# Patient Record
Sex: Male | Born: 1990 | Race: Black or African American | Hispanic: No | Marital: Single
Health system: Midwestern US, Community
[De-identification: ages and names within clinical notes are randomized; demographics above are authoritative.]

## PROBLEM LIST (undated history)

## (undated) HISTORY — PX: WISDOM TOOTH EXTRACTION: SHX21

---
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
## 2010-06-21 ENCOUNTER — Emergency Department (HOSPITAL_COMMUNITY): Admission: EM | Admit: 2010-06-21 | Discharge: 2010-06-21 | Payer: Self-pay | Admitting: Family Medicine

## 2010-10-24 ENCOUNTER — Emergency Department (HOSPITAL_COMMUNITY)
Admission: EM | Admit: 2010-10-24 | Discharge: 2010-10-24 | Payer: Self-pay | Source: Home / Self Care | Admitting: Family Medicine

## 2010-11-04 ENCOUNTER — Emergency Department (HOSPITAL_COMMUNITY)
Admission: EM | Admit: 2010-11-04 | Discharge: 2010-11-04 | Payer: Self-pay | Source: Home / Self Care | Admitting: Family Medicine

## 2011-01-25 LAB — GC/CHLAMYDIA PROBE AMP, GENITAL
Chlamydia, DNA Probe: POSITIVE — AB
GC Probe Amp, Genital: NEGATIVE

## 2011-01-29 LAB — GC/CHLAMYDIA PROBE AMP, GENITAL: GC Probe Amp, Genital: POSITIVE — AB

## 2011-10-14 ENCOUNTER — Emergency Department (HOSPITAL_COMMUNITY)
Admission: EM | Admit: 2011-10-14 | Discharge: 2011-10-14 | Disposition: A | Discharge status: Home / Self Care | Payer: 59 | Source: Home / Self Care

## 2011-10-14 ENCOUNTER — Encounter: Payer: Self-pay | Admitting: Emergency Medicine

## 2011-10-14 DIAGNOSIS — Z202 Contact with and (suspected) exposure to infections with a predominantly sexual mode of transmission: Secondary | ICD-10-CM

## 2011-10-14 LAB — RPR: RPR Ser Ql: NONREACTIVE

## 2011-10-14 NOTE — ED Notes (Signed)
Patient reports partner may have genital herpes.  Patient wants to be checked.  Denies rashes, denies pain, denies blisters, denies discharge

## 2011-10-17 ENCOUNTER — Encounter (HOSPITAL_COMMUNITY): Payer: Self-pay | Admitting: Physician Assistant

## 2011-10-17 NOTE — ED Provider Notes (Signed)
Medical screening examination/treatment/procedure(s) were performed by non-physician practitioner and as supervising physician I was immediately available for consultation/collaboration.  Corrie Mckusick, MD 10/17/11 2033

## 2011-10-17 NOTE — ED Provider Notes (Signed)
History     CSN: [id number] Arrival date & time: 10/14/2011 11:57 AM   None     Chief Complaint  Patient presents with  . Exposure to STD    (Consider location/radiation/quality/duration/timing/severity/associated sxs/prior treatment) HPI Comments: Pt states his girlfriend recently diagnosed with genital herpes - has an outbreak. He denies hx of genital lesions or ulcers. Requests testing for herpes. No dysuria or penile discharge. Has questions regarding herpes, transmission, etc.   Patient is a 20 y.o. male presenting with STD exposure. The history is provided by the patient.  Exposure to STD This is a new problem. Episode onset: unknown. Pertinent negatives include no chest pain, no abdominal pain, no headaches and no shortness of breath. The symptoms are aggravated by nothing. The symptoms are relieved by nothing. He has tried nothing for the symptoms.    History reviewed. No pertinent past medical history.  History reviewed. No pertinent past surgical history.  History reviewed. No pertinent family history.  History  Substance Use Topics  . Smoking status: Never Smoker   . Smokeless tobacco: Not on file  . Alcohol Use: Yes      Review of Systems  Constitutional: Negative for fever and chills.  Respiratory: Negative for shortness of breath.   Cardiovascular: Negative for chest pain.  Gastrointestinal: Negative for abdominal pain.  Genitourinary: Negative for dysuria, discharge, genital sores and penile pain.  Neurological: Negative for headaches.    Allergies  Review of patient's allergies indicates no known allergies.  Home Medications  No current outpatient prescriptions on file.  BP 121/66  Pulse 80  Temp(Src) 98.4 F (36.9 C) (Oral)  Resp 18  SpO2 99%  Physical Exam  Nursing note and vitals reviewed. Constitutional: He appears well-developed and well-nourished. No distress.  Cardiovascular: Normal rate, regular rhythm and normal heart sounds.     Pulmonary/Chest: Effort normal and breath sounds normal. No respiratory distress.  Genitourinary: Testes normal and penis normal. Circumcised.  Lymphadenopathy:       Right: No inguinal adenopathy present.       Left: No inguinal adenopathy present.  Skin: Skin is warm and dry. No rash noted.  Psychiatric: He has a normal mood and affect.    ED Course  Procedures (including critical care time)   Labs Reviewed  GC/CHLAMYDIA PROBE AMP, GENITAL  HIV ANTIBODY (ROUTINE TESTING)  HSV 2 ANTIBODY, IGG  RPR  LAB REPORT - SCANNED   No results found.   1. Exposure to STD       MDM          Melody Comas, PA 10/17/11 [phone]

## 2011-10-18 ENCOUNTER — Telehealth (HOSPITAL_COMMUNITY): Payer: Self-pay | Admitting: *Deleted

## 2012-05-12 ENCOUNTER — Emergency Department (HOSPITAL_COMMUNITY)
Admission: EM | Admit: 2012-05-12 | Discharge: 2012-05-12 | Disposition: A | Discharge status: Home / Self Care | Payer: 59 | Source: Home / Self Care | Attending: Family Medicine | Admitting: Family Medicine

## 2012-05-12 ENCOUNTER — Encounter (HOSPITAL_COMMUNITY): Payer: Self-pay | Admitting: *Deleted

## 2012-05-12 DIAGNOSIS — Z202 Contact with and (suspected) exposure to infections with a predominantly sexual mode of transmission: Secondary | ICD-10-CM

## 2012-05-12 MED ORDER — AZITHROMYCIN 250 MG PO TABS
ORAL_TABLET | ORAL | Status: AC
  Filled 2012-05-12: qty 4

## 2012-05-12 MED ORDER — CEFTRIAXONE SODIUM 250 MG IJ SOLR
INTRAMUSCULAR | Status: AC
  Filled 2012-05-12: qty 250

## 2012-05-12 MED ORDER — AZITHROMYCIN 250 MG PO TABS: 1000.0000 mg | ORAL_TABLET | Freq: Once | ORAL | Status: DC

## 2012-05-12 MED ORDER — CEFTRIAXONE SODIUM 1 G IJ SOLR: 250.0000 mg | Freq: Once | INTRAMUSCULAR | Status: DC

## 2012-05-12 NOTE — Discharge Instructions (Signed)
We will call with test results and treat as indicated °

## 2012-05-12 NOTE — ED Notes (Signed)
Pt reports   He  May  Have  Been exposed  To  An  STD  About 1  Week  Ago     He  denys  Any  Symptoms         He  Also  Reports   Chronic  Back pains      denys  Any  Recent injury

## 2012-05-12 NOTE — ED Provider Notes (Signed)
History     CSN: [id number]  Arrival date & time 05/12/12  1416   First MD Initiated Contact with Patient 05/12/12 1417      Chief Complaint  Patient presents with  . Exposure to STD    (Consider location/radiation/quality/duration/timing/severity/associated sxs/prior treatment) Patient is a 21 y.o. male presenting with STD exposure. The history is provided by the patient.  Exposure to STD This is a new problem. The current episode started more than 1 week ago. The problem has not changed since onset.Associated symptoms comments: Pt denies any sx.Marland Kitchen    History reviewed. No pertinent past medical history.  History reviewed. No pertinent past surgical history.  No family history on file.  History  Substance Use Topics  . Smoking status: Never Smoker   . Smokeless tobacco: Not on file  . Alcohol Use: Yes      Review of Systems  Constitutional: Negative.   Gastrointestinal: Negative.   Genitourinary: Negative.   Skin: Negative for rash.    Allergies  Review of patient's allergies indicates no known allergies.  Home Medications  No current outpatient prescriptions on file.  There were no vitals taken for this visit.  Physical Exam  Nursing note and vitals reviewed. Constitutional: He is oriented to person, place, and time. He appears well-developed and well-nourished.  Abdominal: Soft. Bowel sounds are normal. There is no tenderness. Hernia confirmed negative in the right inguinal area and confirmed negative in the left inguinal area.  Genitourinary: Testes normal and penis normal. Circumcised. No penile erythema or penile tenderness. No discharge found.  Lymphadenopathy:       Right: No inguinal adenopathy present.       Left: No inguinal adenopathy present.  Neurological: He is alert and oriented to person, place, and time.  Skin: Skin is warm and dry.    ED Course  Procedures (including critical care time)   Labs Reviewed  GC/CHLAMYDIA PROBE AMP,  GENITAL   No results found.   1. Exposure to STD       MDM          Linna Hoff, MD 05/12/12 1520

## 2012-05-15 ENCOUNTER — Telehealth (HOSPITAL_COMMUNITY): Payer: Self-pay | Admitting: *Deleted

## 2012-05-15 LAB — GC/CHLAMYDIA PROBE AMP, GENITAL
Chlamydia, DNA Probe: POSITIVE — AB
GC Probe Amp, Genital: NEGATIVE

## 2012-05-15 NOTE — ED Notes (Signed)
GC neg., Chlamydia pos.  Pt. adequately treated with Zithromax and Rocephin.  I called pt. Pt. verified x 2 and given results.  The patient was told he was adequately treated.  Pt. instructed to notify his partner, no sex for 1 week and to practice safe sex. Pt. told he can get HIV testing at the [facility]. STD clinic.  [facility] form completed and faxed to the [facility]. [name] 05/15/2012

## 2012-06-27 ENCOUNTER — Other Ambulatory Visit: Payer: Self-pay | Admitting: Ophthalmology

## 2012-06-27 DIAGNOSIS — H5509 Other forms of nystagmus: Secondary | ICD-10-CM

## 2012-06-27 DIAGNOSIS — H538 Other visual disturbances: Secondary | ICD-10-CM

## 2012-06-28 ENCOUNTER — Ambulatory Visit
Admission: RE | Admit: 2012-06-28 | Discharge: 2012-06-28 | Disposition: A | Discharge status: Home / Self Care | Payer: 59 | Source: Ambulatory Visit | Attending: Ophthalmology | Admitting: Ophthalmology

## 2012-06-28 DIAGNOSIS — H538 Other visual disturbances: Secondary | ICD-10-CM

## 2012-06-28 DIAGNOSIS — H5509 Other forms of nystagmus: Secondary | ICD-10-CM

## 2012-06-28 MED ORDER — GADOBENATE DIMEGLUMINE 529 MG/ML IV SOLN
20.0000 mL | Freq: Once | INTRAVENOUS | Status: AC | PRN
  Administered 2012-06-28: 20 mL via INTRAVENOUS

## 2012-11-15 ENCOUNTER — Encounter (HOSPITAL_COMMUNITY): Payer: Self-pay | Admitting: Emergency Medicine

## 2012-11-15 ENCOUNTER — Other Ambulatory Visit (HOSPITAL_COMMUNITY)
Admission: RE | Admit: 2012-11-15 | Discharge: 2012-11-15 | Disposition: A | Discharge status: Home / Self Care | Payer: 59 | Source: Ambulatory Visit | Attending: Emergency Medicine | Admitting: Emergency Medicine

## 2012-11-15 ENCOUNTER — Emergency Department (HOSPITAL_COMMUNITY)
Admission: EM | Admit: 2012-11-15 | Discharge: 2012-11-15 | Disposition: A | Discharge status: Home / Self Care | Payer: 59 | Source: Home / Self Care | Attending: Emergency Medicine | Admitting: Emergency Medicine

## 2012-11-15 DIAGNOSIS — Z113 Encounter for screening for infections with a predominantly sexual mode of transmission: Secondary | ICD-10-CM | POA: Insufficient documentation

## 2012-11-15 DIAGNOSIS — Z202 Contact with and (suspected) exposure to infections with a predominantly sexual mode of transmission: Secondary | ICD-10-CM

## 2012-11-15 MED ORDER — AZITHROMYCIN 250 MG PO TABS
1000.0000 mg | ORAL_TABLET | Freq: Once | ORAL | Status: AC
  Administered 2012-11-15: 1000 mg via ORAL

## 2012-11-15 MED ORDER — AZITHROMYCIN 250 MG PO TABS
ORAL_TABLET | ORAL | Status: AC
  Filled 2012-11-15: qty 4

## 2012-11-15 NOTE — ED Notes (Signed)
Denies any sxs.  Reports exposure to STD.

## 2012-11-15 NOTE — ED Provider Notes (Signed)
Chief Complaint  Patient presents with  . Exposure to STD    History of Present Illness:   The patient is a 22 year old male who was informed by his partner yesterday that she tested positive for Chlamydia. They last sexual contact was about a week ago. He has had Chlamydia in the past. He denies any symptoms such as discharge, dysuria, or penile pain. He's had no lesions on his penis including ulcerations, sores, blisters, bumps, or rash. No inguinal lymphadenopathy. He denies any systemic symptoms such as fever, chills, headache, stiff neck, skin rash, or joint pain.  Review of Systems:  Other than noted above, the patient denies any of the following symptoms: Systemic:  No fevers chills, aches, weight loss, arthralgias, myalgias, or adenopathy. GI:  No abdominal pain, nausea or vomiting. GU:  No dysuria, penile pain, discharge, itching, dysuria, genital lesions, testicular pain or swelling. Skin:  No rash or itching.  PMFSH:  Past medical history, family history, social history, meds, and allergies were reviewed.  Physical Exam:   Vital signs:  BP 151/69  Pulse 81  Temp 98.6 F (37 C) (Oral)  Resp 18  SpO2 100% Gen:  Alert, oriented, in no distress. Abdomen:  Soft and flat, non-distended, and non-tender.  No organomegaly or mass. Genital:  Exam is completely normal. There was no urethral discharge, no lesions on the penis, normal testes, no hernia, no inguinal lymphadenopathy Skin:  Warm and dry.  No rash.   Other Labs Obtained at [facility]:  DNA probes for gonorrhea, chlamydia, trichomonas were obtained as well as serologies for HIV and syphilis.  Results are pending at this time and we will call about any positive results.  Medications given in [facility]:  He was given azithromycin 1000 mg by mouth and tolerated this well without any immediate side effects.  Assessment:  The encounter diagnosis was Exposure to STD.  Plan:   1.  The following meds were prescribed:   New  Prescriptions   No medications on file   2.  The patient was instructed in symptomatic care and handouts were given. 3.  The patient was told to return if becoming worse in any way, if no better in 3 or 4 days, and given some red flag symptoms that would indicate earlier return. 4.  The patient was instructed to inform all sexual contacts, avoid intercourse completely for 2 weeks and then only with a condom.  The patient was told that we would call about all abnormal lab results, and that we would need to report certain kinds of infection to the health department.    Reuben Likes, MD 11/15/12 [phone]

## 2012-11-16 LAB — HIV ANTIBODY (ROUTINE TESTING W REFLEX): HIV: NONREACTIVE

## 2012-11-16 LAB — RPR: RPR Ser Ql: NONREACTIVE

## 2013-11-29 ENCOUNTER — Encounter (HOSPITAL_COMMUNITY): Payer: Self-pay | Admitting: Emergency Medicine

## 2013-11-29 ENCOUNTER — Emergency Department (HOSPITAL_COMMUNITY)
Admission: EM | Admit: 2013-11-29 | Discharge: 2013-11-29 | Disposition: A | Discharge status: Home / Self Care | Payer: 59 | Source: Home / Self Care

## 2013-11-29 ENCOUNTER — Emergency Department (INDEPENDENT_AMBULATORY_CARE_PROVIDER_SITE_OTHER): Payer: 59

## 2013-11-29 DIAGNOSIS — Y9367 Activity, basketball: Secondary | ICD-10-CM

## 2013-11-29 DIAGNOSIS — S93409A Sprain of unspecified ligament of unspecified ankle, initial encounter: Secondary | ICD-10-CM

## 2013-11-29 DIAGNOSIS — S93402A Sprain of unspecified ligament of left ankle, initial encounter: Secondary | ICD-10-CM

## 2013-11-29 MED ORDER — SODIUM CHLORIDE 0.9 % IV BOLUS (SEPSIS)
1000.0000 mL | Freq: Once | INTRAVENOUS | Status: DC
Start: 2013-11-29 — End: 2013-11-29

## 2013-11-29 NOTE — ED Provider Notes (Signed)
CSN: [id number]     Arrival date & time 11/29/13  0820 History   First MD Initiated Contact with Patient 11/29/13 [phone]     Chief Complaint  Patient presents with  . Ankle Pain   (Consider location/radiation/quality/duration/timing/severity/associated sxs/prior Treatment) HPI Comments: 23 year old male was playing basketball yesterdayinjured his left ankle describing an inversion type injury. Is complaining of pain to the medial and lateral aspect of the malleolus. This occurred yesterday p.m. Is painful to bear weight. There is no deformity but there is swelling about the ankle.  Patient is a 23 y.o. male presenting with ankle pain.  Ankle Pain   History reviewed. No pertinent past medical history. History reviewed. No pertinent past surgical history. History reviewed. No pertinent family history. History  Substance Use Topics  . Smoking status: Current Every Day Smoker  . Smokeless tobacco: Not on file  . Alcohol Use: No    Review of Systems  Constitutional: Negative.   Respiratory: Negative.   Gastrointestinal: Negative.   Genitourinary: Negative.   Musculoskeletal: Positive for joint swelling.       As per HPI  Skin: Negative.   Neurological: Negative for dizziness, weakness, numbness and headaches.    Allergies  Review of patient's allergies indicates no known allergies.  Home Medications  No current outpatient prescriptions on file. BP 137/76  Pulse 80  Temp(Src) 97.8 F (36.6 C) (Oral)  Resp 16  SpO2 100% Physical Exam  Nursing note and vitals reviewed. Constitutional: He is oriented to person, place, and time. He appears well-developed and well-nourished.  HENT:  Head: Normocephalic and atraumatic.  Eyes: EOM are normal. Left eye exhibits no discharge.  Neck: Normal range of motion. Neck supple.  Pulmonary/Chest: Effort normal. No respiratory distress.  Musculoskeletal:  Tenderness to the soft tissues surrounding the left ankle. Mild swelling in the same  area. Full range of motion. Dorsiflexion and plantarflexion intact. No metatarsal tenderness or deformity. Distal neurovascular motor sensory is intact. Pedal pulses 2+.  Neurological: He is alert and oriented to person, place, and time. No cranial nerve deficit.  Skin: Skin is warm and dry.  Psychiatric: He has a normal mood and affect.    ED Course  Procedures (including critical care time) Labs Review Labs Reviewed - No data to display Imaging Review Dg Ankle Complete Left  11/29/2013   CLINICAL DATA:  Left ankle pain after injury.  EXAM: LEFT ANKLE COMPLETE - 3+ VIEW  COMPARISON:  None.  FINDINGS: There is no evidence of fracture, dislocation, or joint effusion. There is no evidence of arthropathy or other focal bone abnormality. Soft tissues are unremarkable.  IMPRESSION: Normal left ankle.   Electronically Signed   By: Roque LiasJames  Green M.D.   On: 11/29/2013 09:18      MDM   1. Sprain of ankle, left    RICE ASO splint Crutches After 3-4 d gradually start wt bearing as tolerated.    Hayden Rasmussenavid Yakub Lodes, NP 11/29/13 [phone]

## 2013-11-29 NOTE — Discharge Instructions (Signed)
Acute Ankle Sprain  with Phase I Rehab  An acute ankle sprain is a partial or complete tear in one or more of the ligaments of the ankle due to traumatic injury. The severity of the injury depends on both the the number of ligaments sprained and the grade of sprain. There are 3 grades of sprains.   · A grade 1 sprain is a mild sprain. There is a slight pull without obvious tearing. There is no loss of strength, and the muscle and ligament are the correct length.  · A grade 2 sprain is a moderate sprain. There is tearing of fibers within the substance of the ligament where it connects two bones or two cartilages. The length of the ligament is increased, and there is usually decreased strength.  · A grade 3 sprain is a complete rupture of the ligament and is uncommon.  In addition to the grade of sprain, there are three types of ankle sprains.   Lateral ankle sprains: This is a sprain of one or more of the three ligaments on the outer side (lateral) of the ankle. These are the most common sprains.  Medial ankle sprains: There is one large triangular ligament of the inner side (medial) of the ankle that is susceptible to injury. Medial ankle sprains are less common.  Syndesmosis, "high ankle," sprains: The syndesmosis is the ligament that connects the two bones of the lower leg. Syndesmosis sprains usually only occur with very severe ankle sprains.  SYMPTOMS  · Pain, tenderness, and swelling in the ankle, starting at the side of injury that may progress to the whole ankle and foot with time.  · "Pop" or tearing sensation at the time of injury.  · Bruising that may spread to the heel.  · Impaired ability to walk soon after injury.  CAUSES   · Acute ankle sprains are caused by trauma placed on the ankle that temporarily forces or pries the anklebone (talus) out of its normal socket.  · Stretching or tearing of the ligaments that normally hold the joint in place (usually due to a twisting injury).  RISK INCREASES  WITH:  · Previous ankle sprain.  · Sports in which the foot may land awkwardly (ie. basketball, volleyball, or soccer) or walking or running on uneven or rough surfaces.  · Shoes with inadequate support to prevent sideways motion when stress occurs.  · Poor strength and flexibility.  · Poor balance skills.  · Contact sports.  PREVENTION   · Warm up and stretch properly before activity.  · Maintain physical fitness:  · Ankle and leg flexibility, muscle strength, and endurance.  · Cardiovascular fitness.  · Balance training activities.  · Use proper technique and have a coach correct improper technique.  · Taping, protective strapping, bracing, or high-top tennis shoes may help prevent injury. Initially, tape is best; however, it loses most of its support function within 10 to 15 minutes.  · Wear proper fitted protective shoes (High-top shoes with taping or bracing is more effective than either alone).  · Provide the ankle with support during sports and practice activities for 12 months following injury.  PROGNOSIS   · If treated properly, ankle sprains can be expected to recover completely; however, the length of recovery depends on the degree of injury.  · A grade 1 sprain usually heals enough in 5 to 7 days to allow modified activity and requires an average of 6 weeks to heal completely.  · A grade 2 sprain requires   6 to 10 weeks to heal completely.  · A grade 3 sprain requires 12 to 16 weeks to heal.  · A syndesmosis sprain often takes more than 3 months to heal.  RELATED COMPLICATIONS   · Frequent recurrence of symptoms may result in a chronic problem. Appropriately addressing the problem the first time decreases the frequency of recurrence and optimizes healing time. Severity of the initial sprain does not predict the likelihood of later instability.  · Injury to other structures (bone, cartilage, or tendon).  · A chronically unstable or arthritic ankle joint is a possiblity with repeated  sprains.  TREATMENT  Treatment initially involves the use of ice, medication, and compression bandages to help reduce pain and inflammation. Ankle sprains are usually immobilized in a walking cast or boot to allow for healing. Crutches may be recommended to reduce pressure on the injury. After immobilization, strengthening and stretching exercises may be necessary to regain strength and a full range of motion. Surgery is rarely needed to treat ankle sprains.  MEDICATION   · Nonsteroidal anti-inflammatory medications, such as aspirin and ibuprofen (do not take for the first 3 days after injury or within 7 days before surgery), or other minor pain relievers, such as acetaminophen, are often recommended. Take these as directed by your caregiver. Contact your caregiver immediately if any bleeding, stomach upset, or signs of an allergic reaction occur from these medications.  · Ointments applied to the skin may be helpful.  · Pain relievers may be prescribed as necessary by your caregiver. Do not take prescription pain medication for longer than 4 to 7 days. Use only as directed and only as much as you need.  HEAT AND COLD  · Cold treatment (icing) is used to relieve pain and reduce inflammation for acute and chronic cases. Cold should be applied for 10 to 15 minutes every 2 to 3 hours for inflammation and pain and immediately after any activity that aggravates your symptoms. Use ice packs or an ice massage.  · Heat treatment may be used before performing stretching and strengthening activities prescribed by your caregiver. Use a heat pack or a warm soak.  SEEK IMMEDIATE MEDICAL CARE IF:   · Pain, swelling, or bruising worsens despite treatment.  · You experience pain, numbness, discoloration, or coldness in the foot or toes.  · New, unexplained symptoms develop (drugs used in treatment may produce side effects.)  EXERCISES   PHASE I EXERCISES  RANGE OF MOTION (ROM) AND STRETCHING EXERCISES - Ankle Sprain, Acute Phase I,  Weeks 1 to 2  These exercises may help you when beginning to restore flexibility in your ankle. You will likely work on these exercises for the 1 to 2 weeks after your injury. Once your physician, physical therapist, or athletic trainer sees adequate progress, he or she will advance your exercises. While completing these exercises, remember:   · Restoring tissue flexibility helps normal motion to return to the joints. This allows healthier, less painful movement and activity.  · An effective stretch should be held for at least 30 seconds.  · A stretch should never be painful. You should only feel a gentle lengthening or release in the stretched tissue.  RANGE OF MOTION - Dorsi/Plantar Flexion  · While sitting with your right / left knee straight, draw the top of your foot upwards by flexing your ankle. Then reverse the motion, pointing your toes downward.  · Hold each position for __________ seconds.  · After completing your first set of   exercises, repeat this exercise with your knee bent.  Repeat __________ times. Complete this exercise __________ times per day.   RANGE OF MOTION - Ankle Alphabet  · Imagine your right / left big toe is a pen.  · Keeping your hip and knee still, write out the entire alphabet with your "pen." Make the letters as large as you can without increasing any discomfort.  Repeat __________ times. Complete this exercise __________ times per day.   STRENGTHENING EXERCISES - Ankle Sprain, Acute -Phase I, Weeks 1 to 2  These exercises may help you when beginning to restore strength in your ankle. You will likely work on these exercises for 1 to 2 weeks after your injury. Once your physician, physical therapist, or athletic trainer sees adequate progress, he or she will advance your exercises. While completing these exercises, remember:   · Muscles can gain both the endurance and the strength needed for everyday activities through controlled exercises.  · Complete these exercises as instructed by  your physician, physical therapist, or athletic trainer. Progress the resistance and repetitions only as guided.  · You may experience muscle soreness or fatigue, but the pain or discomfort you are trying to eliminate should never worsen during these exercises. If this pain does worsen, stop and make certain you are following the directions exactly. If the pain is still present after adjustments, discontinue the exercise until you can discuss the trouble with your clinician.  STRENGTH - Dorsiflexors  · Secure a rubber exercise band/tubing to a fixed object (ie. table, pole) and loop the other end around your right / left foot.  · Sit on the floor facing the fixed object. The band/tubing should be slightly tense when your foot is relaxed.  · Slowly draw your foot back toward you using your ankle and toes.  · Hold this position for __________ seconds. Slowly release the tension in the band and return your foot to the starting position.  Repeat __________ times. Complete this exercise __________ times per day.   STRENGTH - Plantar-flexors   · Sit with your right / left leg extended. Holding onto both ends of a rubber exercise band/tubing, loop it around the ball of your foot. Keep a slight tension in the band.  · Slowly push your toes away from you, pointing them downward.  · Hold this position for __________ seconds. Return slowly, controlling the tension in the band/tubing.  Repeat __________ times. Complete this exercise __________ times per day.   STRENGTH - Ankle Eversion  · Secure one end of a rubber exercise band/tubing to a fixed object (table, pole). Loop the other end around your foot just before your toes.  · Place your fists between your knees. This will focus your strengthening at your ankle.  · Drawing the band/tubing across your opposite foot, slowly, pull your little toe out and up. Make sure the band/tubing is positioned to resist the entire motion.  · Hold this position for __________ seconds.  Have  your muscles resist the band/tubing as it slowly pulls your foot back to the starting position.   Repeat __________ times. Complete this exercise __________ times per day.   STRENGTH - Ankle Inversion  · Secure one end of a rubber exercise band/tubing to a fixed object (table, pole). Loop the other end around your foot just before your toes.  · Place your fists between your knees. This will focus your strengthening at your ankle.  · Slowly, pull your big toe up and in, making   sure the band/tubing is positioned to resist the entire motion.  · Hold this position for __________ seconds.  · Have your muscles resist the band/tubing as it slowly pulls your foot back to the starting position.  Repeat __________ times. Complete this exercises __________ times per day.   STRENGTH - Towel Curls  · Sit in a chair positioned on a non-carpeted surface.  · Place your right / left foot on a towel, keeping your heel on the floor.  · Pull the towel toward your heel by only curling your toes. Keep your heel on the floor.  · If instructed by your physician, physical therapist, or athletic trainer, add weight to the end of the towel.  Repeat __________ times. Complete this exercise __________ times per day.  Document Released: 06/02/2005 Document Revised: 01/24/2012 Document Reviewed: 02/13/2009  [employer]® Patient Information ©2014 [employer].

## 2013-11-29 NOTE — ED Notes (Signed)
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

## 2013-11-30 NOTE — ED Provider Notes (Signed)
Medical screening examination/treatment/procedure(s) were performed by a resident physician or non-physician practitioner and as the supervising physician I was immediately available for consultation/collaboration.  Clementeen GrahamEvan Jarius Dieudonne, MD    Rodolph BongEvan S Jayin Derousse, MD 11/30/13 [phone]

## 2015-04-21 ENCOUNTER — Emergency Department (HOSPITAL_COMMUNITY): Payer: 59

## 2015-04-21 ENCOUNTER — Encounter (HOSPITAL_COMMUNITY): Payer: Self-pay | Admitting: Family Medicine

## 2015-04-21 ENCOUNTER — Emergency Department (HOSPITAL_COMMUNITY)
Admission: EM | Admit: 2015-04-21 | Discharge: 2015-04-21 | Disposition: A | Discharge status: Home / Self Care | Payer: 59 | Attending: Emergency Medicine | Admitting: Emergency Medicine

## 2015-04-21 DIAGNOSIS — I517 Cardiomegaly: Secondary | ICD-10-CM | POA: Diagnosis not present

## 2015-04-21 DIAGNOSIS — R42 Dizziness and giddiness: Secondary | ICD-10-CM | POA: Insufficient documentation

## 2015-04-21 DIAGNOSIS — R531 Weakness: Secondary | ICD-10-CM | POA: Insufficient documentation

## 2015-04-21 DIAGNOSIS — Z720 Tobacco use: Secondary | ICD-10-CM | POA: Diagnosis not present

## 2015-04-21 DIAGNOSIS — R079 Chest pain, unspecified: Secondary | ICD-10-CM | POA: Diagnosis present

## 2015-04-21 DIAGNOSIS — R0602 Shortness of breath: Secondary | ICD-10-CM | POA: Diagnosis not present

## 2015-04-21 DIAGNOSIS — G8929 Other chronic pain: Secondary | ICD-10-CM | POA: Diagnosis not present

## 2015-04-21 LAB — CBC
HCT: 39.8 % (ref 39.0–52.0)
Hemoglobin: 13.3 g/dL (ref 13.0–17.0)
MCH: 29.1 pg (ref 26.0–34.0)
MCHC: 33.4 g/dL (ref 30.0–36.0)
MCV: 87.1 fL (ref 78.0–100.0)
Platelets: 244 10*3/uL (ref 150–400)
RBC: 4.57 MIL/uL (ref 4.22–5.81)
RDW: 12.8 % (ref 11.5–15.5)
WBC: 5.3 10*3/uL (ref 4.0–10.5)

## 2015-04-21 LAB — I-STAT TROPONIN, ED: Troponin i, poc: 0 ng/mL (ref 0.00–0.08)

## 2015-04-21 LAB — BASIC METABOLIC PANEL
ANION GAP: 8 (ref 5–15)
BUN: 9 mg/dL (ref 6–20)
CO2: 23 mmol/L (ref 22–32)
Calcium: 8.9 mg/dL (ref 8.9–10.3)
Chloride: 107 mmol/L (ref 101–111)
Creatinine, Ser: 0.95 mg/dL (ref 0.61–1.24)
GFR calc Af Amer: >60 mL/min (ref >60)
GFR calc non Af Amer: >60 mL/min (ref >60)
GLUCOSE: 94 mg/dL (ref 65–99)
Potassium: 3.8 mmol/L (ref 3.5–5.1)
SODIUM: 138 mmol/L (ref 135–145)

## 2015-04-21 LAB — BRAIN NATRIURETIC PEPTIDE: B NATRIURETIC PEPTIDE 5: 3.8 pg/mL (ref 0.0–100.0)

## 2015-04-21 LAB — TSH: TSH: 2.617 u[IU]/mL (ref 0.350–4.500)

## 2015-04-21 NOTE — ED Provider Notes (Signed)
[name]     Arrival date & time 04/21/15  0736 History   First MD Initiated Contact with Patient 04/21/15 0745     Chief Complaint  Patient presents with  . Dizziness  . Chest Pain     (Consider location/radiation/quality/duration/timing/severity/associated sxs/prior Treatment) Patient is a 24 y.o. male presenting with dizziness. The history is provided by the patient.  Dizziness Quality:  Lightheadedness Severity:  Moderate Onset quality:  Gradual Duration:  6 months Timing:  Intermittent Progression:  Waxing and waning Chronicity:  Chronic Context: physical activity (working in heat)   Context: not with loss of consciousness   Relieved by:  Nothing Worsened by:  Nothing Ineffective treatments:  None tried Associated symptoms: chest pain (left sided sharp), shortness of breath and weakness   Associated symptoms: no blood in stool, no diarrhea, no syncope and no vomiting     History reviewed. No pertinent past medical history. History reviewed. No pertinent past surgical history. History reviewed. No pertinent family history. History  Substance Use Topics  . Smoking status: Current Every Day Smoker  . Smokeless tobacco: Not on file  . Alcohol Use: No    Review of Systems  Respiratory: Positive for shortness of breath.   Cardiovascular: Positive for chest pain (left sided sharp). Negative for syncope.  Gastrointestinal: Negative for vomiting, diarrhea and blood in stool.  Neurological: Positive for dizziness and weakness.  All other systems reviewed and are negative.     Allergies  Review of patient's allergies indicates no known allergies.  Home Medications   Prior to Admission medications   Not on File   BP 139/75 mmHg  Pulse 65  Temp(Src) 98.5 F (36.9 C) (Oral)  Resp 19  Ht 6\' 1"  (1.854 m)  Wt 350 lb (158.759 kg)  BMI 46.19 kg/m2  SpO2 97% Physical Exam  Constitutional: He is oriented to person, place, and time. He appears well-developed  and well-nourished. No distress.  HENT:  Head: Normocephalic and atraumatic.  Eyes: Conjunctivae are normal.  Neck: Neck supple. No tracheal deviation present.  Cardiovascular: Normal rate, regular rhythm and normal heart sounds.  Exam reveals no gallop and no friction rub.   No murmur heard. Pulmonary/Chest: Effort normal and breath sounds normal. No respiratory distress. He has no wheezes. He has no rales. He exhibits tenderness (left sided).  Abdominal: Soft. He exhibits no distension. There is no tenderness.  Neurological: He is alert and oriented to person, place, and time.  Skin: Skin is warm and dry. No rash noted.  Psychiatric: He has a normal mood and affect.  Vitals reviewed.   ED Course  Procedures (including critical care time) Labs Review Labs Reviewed  CBC  BASIC METABOLIC PANEL  TSH  BRAIN NATRIURETIC PEPTIDE  I-STAT TROPOININ, ED    Imaging Review Dg Chest 2 View  04/21/2015   CLINICAL DATA:  Several month history of progressive dizziness and chest pain, current smoker.  EXAM: CHEST  2 VIEW  COMPARISON:  Report of a chest x-ray dated January 23, 2003  FINDINGS: The lungs are adequately inflated. There is patchy increased interstitial density in the mid and lower lungs bilaterally. There is no alveolar infiltrate. There is no pleural effusion. The cardiac silhouette is top-normal in size. The pulmonary vascularity is normal. The mediastinum is normal in width. There is gentle levocurvature centered at approximately T10.  IMPRESSION: Patchy increased interstitial density consists is suspicious for interstitial edema or pneumonia, pneumonitis, or mild interstitial edema. There is no pulmonary vascular  congestion.  Correlation with patient's other clinical symptoms is needed to judge whether this might reflect an active infectious process versus low-grade CHF.   Electronically Signed   By: David  [place] M.D.   On: 04/21/2015 08:19     EKG Interpretation   Date/Time:  Monday  April 21 2015 07:40:34 EDT Ventricular Rate:  69 PR Interval:  176 QRS Duration: 84 QT Interval:  362 QTC Calculation: 387 R Axis:   21 Text Interpretation:  Normal sinus rhythm with sinus arrhythmia No  significant change since last tracing Confirmed by HARRISON  MD, FORREST  ([id number]) on 04/21/2015 7:54:41 AM     EKG without ST or T wave changes concerning for myocardial ischemia. No delta wave, no prolonged QTc, no brugada to suggest arrhythmogenicity.    Emergency Focused Ultrasound Exam Limited Ultrasound of the Heart and Pericardium  Performed and interpreted by Dr. Romeo Apple Indication: shortness of breath Multiple views of the heart, pericardium, and IVC are obtained with a multi frequency probe.  Findings: preserved contractility, no anechoic fluid, mild IVC collapse Interpretation: normal ejection fraction, no pericardial effusion, no elevated CVP  CPT Code: 40981  MDM   Final diagnoses:  Mild cardiomegaly   24 year old otherwise healthy male presents with ongoing difficulty with intermittent dizziness, mostly with activity. He states this started insidiously 6 months ago or more. He appears otherwise well but since there reports of his daughter he has noticed steady weight gain related to a decrease in activity. He has been more fatigued lately.  The patient has borderline cardiomegaly on x-ray with some evidence of interstitial edema suggestive of low-grade heart failure. He has a family history in his father and grandfather of early cardiac death related to ischemic heart disease. He also has a stated history of lupus and his father.  He is not currently symptomatic at rest and he is otherwise hemodynamically stable. He has no active signs of ischemia on EKG and he does not have any troponin elevation or BNP elevation. Given his strong family history and abnormal findings on chest x-ray accompanying his symptoms, cardiology was consulted from the emergency department for  establishing follow-up with a formal echo at a later date.  Lyndal Pulley, MD 04/21/15 1138  Purvis Sheffield, MD 04/21/15 [phone]

## 2015-04-21 NOTE — Discharge Instructions (Signed)

## 2015-04-21 NOTE — ED Notes (Signed)
Per pt sts intermittent chest pain and dizziness for months but worsening over the last few weeks.

## 2015-04-21 NOTE — ED Notes (Signed)
Lab at the bedside 

## 2015-04-21 NOTE — ED Notes (Signed)
Lab back at bedside. Dr. Clydene PughKnott at the bedside.

## 2015-04-21 NOTE — ED Notes (Signed)
Patient transported to X-ray 

## 2015-04-22 ENCOUNTER — Ambulatory Visit (INDEPENDENT_AMBULATORY_CARE_PROVIDER_SITE_OTHER): Payer: 59 | Admitting: Cardiovascular Disease

## 2015-04-22 ENCOUNTER — Encounter: Payer: Self-pay | Admitting: Cardiovascular Disease

## 2015-04-22 VITALS — BP 142/68 | HR 66 | Resp 16 | Ht 73.0 in | Wt 343.9 lb

## 2015-04-22 DIAGNOSIS — I319 Disease of pericardium, unspecified: Secondary | ICD-10-CM | POA: Diagnosis not present

## 2015-04-22 DIAGNOSIS — I517 Cardiomegaly: Secondary | ICD-10-CM | POA: Insufficient documentation

## 2015-04-22 DIAGNOSIS — R0789 Other chest pain: Secondary | ICD-10-CM | POA: Insufficient documentation

## 2015-04-22 DIAGNOSIS — R079 Chest pain, unspecified: Secondary | ICD-10-CM

## 2015-04-22 HISTORY — DX: Other chest pain: R07.89

## 2015-04-22 NOTE — Progress Notes (Signed)
Patient ID: Jeffrey Mack, male   DOB: [birth year], 24 y.o.   MRN: [id number]     Cardiology Office Note   Date:  04/22/2015   ID:  Jeffrey Mack, DOB 1991/04/25, MRN [id number]  PCP:  No PCP Per Patient  Cardiologist:   Thurmon Fair, MD   Chief Complaint  Patient presents with  . New Evaluation    Referred for evaluation of cardiomegally.  Occas. chest pain over the past 1-2 month lasting a couple minutes.  Occas. SOB and dizziness. States he quit smoking cigarettes and marijuana 2 days ago.      History of Present Illness: Jeffrey Mack is a 24 y.o. male who presents for follow-up after an emergency room visit yesterday. He had sharp chest pain that was to some degree positional and would worsen when laying backwards or leaning forwards. He also had some coughing. His symptoms have 40 improved. He denies any dyspnea. 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 He denies syncope, palpitations, edema or focal neurological symptoms. Sometimes he has muscle cramps involving his fingers or his abdominal muscles when working out in the heat.   She went to the emergency room with these complaints and his chest x-ray was interpreted by the emergency room physician as showing borderline cardiomegaly. This was not confirmed on the official report, but the report raise concerns for interstitial markings that could represent early congestive heart failure. His BNP was 3.8. Cardiac troponin was normal as were all his other labs. His electrocardiogram was completely normal.  He does not have diabetes mellitus, hypertension, known cardiac or vascular disease. There is a family history of early CAD, but not of arrhythmic death, as far as the patient knows. He smokes cigarettes but is planning to quit. He has already stopped using marijuana which he thinks has increased his appetite tremendously leading to  obesity.  History reviewed. No pertinent past medical history.  History reviewed. No pertinent past surgical history.   No current outpatient prescriptions on file.   No current facility-administered medications for this visit.    Allergies:   Review of patient's allergies indicates no known allergies.    Social History:  The patient  reports that he has been smoking.  He does not have any smokeless tobacco history on file. He reports that he uses illicit drugs (Marijuana). He reports that he does not drink alcohol.   Family History:  The patient's family history includes Diabetes in his maternal grandfather; Heart attack in his paternal grandfather; Kidney disease in his father; Lupus in his father.    ROS:  Please see the history of present illness.    Otherwise, review of systems positive for none.   All other systems are reviewed and negative.    PHYSICAL EXAM: VS:  BP 142/68 mmHg  Pulse 66  Resp 16  Ht  (1.854 m)  Wt 155.992 kg (343 lb 14.4 oz)  BMI 45.38 kg/m2 , BMI Body mass index is 45.38 kg/(m^2). He is very muscular but also severely obese General: Alert, oriented x3, no distress Head: no evidence of trauma, PERRL, EOMI, no exophtalmos or lid lag, no myxedema, no xanthelasma; normal ears, nose and oropharynx Neck: normal jugular venous pulsations and no hepatojugular reflux; brisk carotid pulses without delay and no carotid bruits Chest: clear to auscultation, no signs of consolidation by percussion or palpation, normal fremitus, symmetrical and full respiratory excursions  Cardiovascular: normal position and quality of the apical impulse, regular rhythm, normal first and second heart sounds, no murmurs, rubs or gallops Abdomen: no tenderness or distention, no masses by palpation, no abnormal pulsatility or arterial bruits, normal bowel sounds, no hepatosplenomegaly Extremities: no clubbing, cyanosis or edema; 2+ radial, ulnar and brachial pulses bilaterally; 2+  right femoral, posterior tibial and dorsalis pedis pulses; 2+ left femoral, posterior tibial and dorsalis pedis pulses; no subclavian or femoral bruits Neurological: grossly nonfocal Psych: euthymic mood, full affect   EKG:  EKG is not ordered today. The ekg ordered today demonstrates NSR, sinus arrhythmia of youth, normal repolarization   Recent Labs: 04/21/2015: B Natriuretic Peptide 3.8; BUN 9; Creatinine 0.95; Hemoglobin 13.3; Platelets 244; Potassium 3.8; Sodium 138; TSH 2.617    Lipid Panel No results found for: CHOL, TRIG, HDL, CHOLHDL, VLDL, LDLCALC, LDLDIRECT    Wt Readings from Last 3 Encounters:  04/22/15 155.992 kg (343 lb 14.4 oz)  04/21/15 158.759 kg (350 lb)      Other studies Reviewed: Additional studies/ records that were reviewed today include: Emergency room records including electrocardiogram, chest x-ray images and labs  ASSESSMENT AND PLAN:  Although no pathognomonic features are available, the history is most consistent with transient acute viral pericarditis (although some features are more consistent with musculoskeletal pain). Increased interstitial markings on chest x-ray raised concern for possible congestive heart failure, but simultaneous viral pulmonary inflammation could have caused a similar pattern. His BNP was extremely low, even allowing for young age and obesity which could mask congestive heart failure. For additional reassurance offered a formal echocardiogram and we will call him with the results. Anticipate normal findings. He is strongly encouraged to stop smoking cigarettes and he states that he has already decided to do so. He has stopped using marijuana which causes appetite to increase tremendously in which she blames for his current obesity. His blood pressure is borderline elevated, but I think simple attention to weight loss will help bring his blood pressure back to normal range. Note that at the time of his emergency room visit his  blood pressure was high normal.   Current medicines are reviewed at length with the patient today.  The patient does not have concerns regarding medicines.  The following changes have been made:  no change  Labs/ tests ordered today include:   Orders Placed This Encounter  Procedures  . ECHOCARDIOGRAM COMPLETE   Patient Instructions  Your physician has requested that you have an echocardiogram. Echocardiography is a painless test that uses sound waves to create images of your heart. It provides your doctor with information about the size and shape of your heart and how well your heart's chambers and valves are working. This procedure takes approximately one hour. There are no restrictions for this procedure.  Dr Royann Shiversroitoru recommends that you follow-up appointment as needed.     Joie BimlerSigned, Chassity Ludke, MD  04/22/2015 8:48 AM    Thurmon FairMihai Mimi Debellis, MD, [facility] [phone] office [phone] pager

## 2015-04-22 NOTE — Patient Instructions (Signed)
Your physician has requested that you have an echocardiogram. Echocardiography is a painless test that uses sound waves to create images of your heart. It provides your doctor with information about the size and shape of your heart and how well your heart's chambers and valves are working. This procedure takes approximately one hour. There are no restrictions for this procedure.  Dr Royann Shiversroitoru recommends that you follow-up appointment as needed.

## 2015-04-23 ENCOUNTER — Encounter (HOSPITAL_BASED_OUTPATIENT_CLINIC_OR_DEPARTMENT_OTHER): Payer: Self-pay | Admitting: Emergency Medicine

## 2015-04-30 ENCOUNTER — Inpatient Hospital Stay (HOSPITAL_COMMUNITY): Admission: RE | Admit: 2015-04-30 | Payer: 59 | Source: Ambulatory Visit

## 2015-05-05 ENCOUNTER — Telehealth (HOSPITAL_COMMUNITY): Payer: Self-pay | Admitting: *Deleted

## 2016-01-01 DIAGNOSIS — H52223 Regular astigmatism, bilateral: Secondary | ICD-10-CM | POA: Diagnosis not present

## 2016-01-01 DIAGNOSIS — H5203 Hypermetropia, bilateral: Secondary | ICD-10-CM | POA: Diagnosis not present

## 2016-03-26 ENCOUNTER — Emergency Department (HOSPITAL_COMMUNITY)
Admission: EM | Admit: 2016-03-26 | Discharge: 2016-03-26 | Disposition: A | Discharge status: Home / Self Care | Payer: 59 | Attending: Emergency Medicine | Admitting: Emergency Medicine

## 2016-03-26 ENCOUNTER — Encounter (HOSPITAL_COMMUNITY): Payer: Self-pay | Admitting: Emergency Medicine

## 2016-03-26 DIAGNOSIS — F17200 Nicotine dependence, unspecified, uncomplicated: Secondary | ICD-10-CM | POA: Insufficient documentation

## 2016-03-26 DIAGNOSIS — Y9389 Activity, other specified: Secondary | ICD-10-CM | POA: Insufficient documentation

## 2016-03-26 DIAGNOSIS — S60812A Abrasion of left wrist, initial encounter: Secondary | ICD-10-CM | POA: Diagnosis not present

## 2016-03-26 DIAGNOSIS — S0501XA Injury of conjunctiva and corneal abrasion without foreign body, right eye, initial encounter: Secondary | ICD-10-CM | POA: Diagnosis not present

## 2016-03-26 DIAGNOSIS — Z23 Encounter for immunization: Secondary | ICD-10-CM | POA: Insufficient documentation

## 2016-03-26 DIAGNOSIS — S0990XA Unspecified injury of head, initial encounter: Secondary | ICD-10-CM | POA: Diagnosis not present

## 2016-03-26 DIAGNOSIS — Y9289 Other specified places as the place of occurrence of the external cause: Secondary | ICD-10-CM | POA: Insufficient documentation

## 2016-03-26 DIAGNOSIS — Y998 Other external cause status: Secondary | ICD-10-CM | POA: Insufficient documentation

## 2016-03-26 DIAGNOSIS — S0991XA Unspecified injury of ear, initial encounter: Secondary | ICD-10-CM | POA: Diagnosis not present

## 2016-03-26 DIAGNOSIS — S0591XA Unspecified injury of right eye and orbit, initial encounter: Secondary | ICD-10-CM | POA: Diagnosis present

## 2016-03-26 DIAGNOSIS — R51 Headache: Secondary | ICD-10-CM | POA: Diagnosis not present

## 2016-03-26 MED ORDER — ERYTHROMYCIN 5 MG/GM OP OINT: TOPICAL_OINTMENT | OPHTHALMIC | Status: DC

## 2016-03-26 MED ORDER — FLUORESCEIN SODIUM 1 MG OP STRP
1.0000 | ORAL_STRIP | Freq: Once | OPHTHALMIC | Status: AC
  Administered 2016-03-26: 1 via OPHTHALMIC
  Filled 2016-03-26: qty 1

## 2016-03-26 MED ORDER — TETRACAINE HCL 0.5 % OP SOLN
1.0000 [drp] | Freq: Once | OPHTHALMIC | Status: AC
  Administered 2016-03-26: 1 [drp] via OPHTHALMIC
  Filled 2016-03-26: qty 2

## 2016-03-26 MED ORDER — TETANUS-DIPHTH-ACELL PERTUSSIS 5-2.5-18.5 LF-MCG/0.5 IM SUSP
0.5000 mL | Freq: Once | INTRAMUSCULAR | Status: AC
  Administered 2016-03-26: 0.5 mL via INTRAMUSCULAR
  Filled 2016-03-26: qty 0.5
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
## 2016-03-26 NOTE — Discharge Instructions (Signed)
Please read and follow all provided instructions.  Your diagnoses today include:  1. Corneal abrasion, right, initial encounter    Tests performed today include:  Visual acuity testing to check your vision  Fluorescein dye examination to look for scratches on your eye  Vital signs. See below for your results today.   Medications prescribed:   Take any prescribed medications only as directed.  Home care instructions:  Follow any educational materials contained in this packet.  You have a scratch of the eye on the cornea (the clear part of the eye). This condition may be caused by trauma. It is a common problem for people who wear contact lenses. Proper treatment is important. No evidence of infection is noted today, but you could develop an infection called a corneal ulcer or have some retained foreign body that may or may not have been noted today in the Emergency Department. Ulcers are not only painful, but they may also scar the cornea and cause permanent damage to the eye.   If you wear contact lenses, do not use them until your eye caregiver approves. Follow-up care is necessary to be sure the corneal abrasion is healing if not completely resolved in 2-3 days. See your caregiver or eye specialist as suggested for followup.   Follow-up instructions: Please follow-up with the opthalmologist listed in the next 2-3 days for further evaluation of your symptoms.   Return instructions:   Please return to the Emergency Department if you experience worsening symptoms.   Please return immediately if you develop severe pain, pus drainage, new change in vision, or fever.  Please return if you have any other emergent concerns.  Additional Information:  Your vital signs today were: BP 131/72 mmHg   Pulse 87   Temp(Src) 97.9 F (36.6 C) (Oral)   Resp 18   SpO2 100% If your blood pressure (BP) was elevated above 135/85 this visit, please have this repeated by your doctor within one  month.

## 2016-03-26 NOTE — ED Notes (Signed)
Pt does not have glasses to do visual acuity screening

## 2017-10-29 ENCOUNTER — Encounter (HOSPITAL_COMMUNITY): Payer: Self-pay

## 2017-10-29 ENCOUNTER — Emergency Department (HOSPITAL_COMMUNITY): Payer: 59

## 2017-10-29 ENCOUNTER — Other Ambulatory Visit: Payer: Self-pay

## 2017-10-29 ENCOUNTER — Emergency Department (HOSPITAL_COMMUNITY)
Admission: EM | Admit: 2017-10-29 | Discharge: 2017-10-29 | Disposition: A | Discharge status: Home / Self Care | Payer: 59 | Attending: Emergency Medicine | Admitting: Emergency Medicine

## 2017-10-29 DIAGNOSIS — Y92512 Supermarket, store or market as the place of occurrence of the external cause: Secondary | ICD-10-CM | POA: Insufficient documentation

## 2017-10-29 DIAGNOSIS — W19XXXA Unspecified fall, initial encounter: Secondary | ICD-10-CM

## 2017-10-29 DIAGNOSIS — S40012A Contusion of left shoulder, initial encounter: Secondary | ICD-10-CM | POA: Insufficient documentation

## 2017-10-29 DIAGNOSIS — F17200 Nicotine dependence, unspecified, uncomplicated: Secondary | ICD-10-CM | POA: Insufficient documentation

## 2017-10-29 DIAGNOSIS — Y9301 Activity, walking, marching and hiking: Secondary | ICD-10-CM | POA: Insufficient documentation

## 2017-10-29 DIAGNOSIS — W000XXA Fall on same level due to ice and snow, initial encounter: Secondary | ICD-10-CM | POA: Insufficient documentation

## 2017-10-29 DIAGNOSIS — Y998 Other external cause status: Secondary | ICD-10-CM | POA: Insufficient documentation

## 2017-10-29 MED ORDER — CYCLOBENZAPRINE HCL 5 MG PO TABS: 5.0000 mg | ORAL_TABLET | Freq: Three times a day (TID) | ORAL | 0 refills | Status: DC | PRN

## 2017-10-29 MED ORDER — IBUPROFEN 800 MG PO TABS: 800.0000 mg | ORAL_TABLET | Freq: Three times a day (TID) | ORAL | 0 refills | Status: DC | PRN

## 2017-10-29 NOTE — ED Triage Notes (Signed)
Patient complains of left arm and shoulder pain after falling this am outside, pain with ROM

## 2017-10-29 NOTE — Discharge Instructions (Signed)
It was my pleasure taking care of you today!   Ibuprofen as needed for pain. Flexeril is your muscle relaxer to take as needed. This can make you very drowsy - please do not drink alcohol, operate heavy machinery or drive on this medication.   Warm showers can help with muscle tightness. Ice to affected area as needed for pain / swelling relief.   Follow up with your primary care provider if symptoms do not improve in 1 week.   Return to ER for new or worsening symptoms, any additional concerns.
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
## 2018-05-29 ENCOUNTER — Ambulatory Visit (INDEPENDENT_AMBULATORY_CARE_PROVIDER_SITE_OTHER): Payer: Self-pay

## 2018-05-29 ENCOUNTER — Encounter (HOSPITAL_COMMUNITY): Payer: Self-pay | Admitting: Emergency Medicine

## 2018-05-29 ENCOUNTER — Ambulatory Visit (HOSPITAL_COMMUNITY)
Admission: EM | Admit: 2018-05-29 | Discharge: 2018-05-29 | Disposition: A | Discharge status: Home / Self Care | Payer: Self-pay | Attending: Family Medicine | Admitting: Family Medicine

## 2018-05-29 DIAGNOSIS — M25561 Pain in right knee: Secondary | ICD-10-CM

## 2018-05-29 MED ORDER — CYCLOBENZAPRINE HCL 5 MG PO TABS: 5.0000 mg | ORAL_TABLET | Freq: Every day | ORAL | 0 refills | Status: DC

## 2018-05-29 MED ORDER — IBUPROFEN 800 MG PO TABS
800.0000 mg | ORAL_TABLET | Freq: Once | ORAL | Status: AC
  Administered 2018-05-29: 800 mg via ORAL

## 2018-05-29 MED ORDER — NAPROXEN 500 MG PO TABS: 500.0000 mg | ORAL_TABLET | Freq: Two times a day (BID) | ORAL | 0 refills | Status: DC

## 2018-05-29 MED ORDER — IBUPROFEN 800 MG PO TABS
ORAL_TABLET | ORAL | Status: AC
  Filled 2018-05-29: qty 1
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
## 2018-05-29 NOTE — Discharge Instructions (Signed)
Ice, elevation, naproxen twice a day for pain control. Take with food.  Activity as tolerated.  If symptoms worsen or do not improve in the next 2-3 weeks to return to be seen or to follow up with your PCP.
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
## 2019-10-15 ENCOUNTER — Encounter (HOSPITAL_COMMUNITY): Payer: Self-pay

## 2019-10-15 ENCOUNTER — Other Ambulatory Visit: Payer: Self-pay

## 2019-10-15 ENCOUNTER — Ambulatory Visit (HOSPITAL_COMMUNITY)
Admission: EM | Admit: 2019-10-15 | Discharge: 2019-10-15 | Disposition: A | Discharge status: Home / Self Care | Payer: Self-pay | Attending: Family Medicine | Admitting: Family Medicine

## 2019-10-15 DIAGNOSIS — R300 Dysuria: Secondary | ICD-10-CM

## 2019-10-15 DIAGNOSIS — Z113 Encounter for screening for infections with a predominantly sexual mode of transmission: Secondary | ICD-10-CM

## 2019-10-15 DIAGNOSIS — Z202 Contact with and (suspected) exposure to infections with a predominantly sexual mode of transmission: Secondary | ICD-10-CM | POA: Insufficient documentation

## 2019-10-15 MED ORDER — AZITHROMYCIN 250 MG PO TABS
ORAL_TABLET | ORAL | Status: AC
  Filled 2019-10-15: qty 4

## 2019-10-15 MED ORDER — LIDOCAINE HCL (PF) 1 % IJ SOLN
INTRAMUSCULAR | Status: AC
  Filled 2019-10-15: qty 2

## 2019-10-15 MED ORDER — CEFTRIAXONE SODIUM 250 MG IJ SOLR
250.0000 mg | Freq: Once | INTRAMUSCULAR | Status: AC
  Administered 2019-10-15: 16:00:00 250 mg via INTRAMUSCULAR

## 2019-10-15 MED ORDER — AZITHROMYCIN 250 MG PO TABS
1000.0000 mg | ORAL_TABLET | Freq: Once | ORAL | Status: AC
  Administered 2019-10-15: 16:00:00 1000 mg via ORAL

## 2019-10-15 MED ORDER — CEFTRIAXONE SODIUM 250 MG IJ SOLR
INTRAMUSCULAR | Status: AC
  Filled 2019-10-15: qty 250

## 2019-10-15 NOTE — ED Triage Notes (Signed)
Pt presents for STD Testing; pt is not having any symptoms.

## 2019-10-15 NOTE — Discharge Instructions (Addendum)
You have been treated for gonorrhea and chlamydia Testing has been sent to the laboratory You will be notified if any of your tests are positive Avoid sexual relations for 7 days after treatment

## 2019-10-15 NOTE — ED Provider Notes (Signed)
[facility]    [name] Arrival date & time: 10/15/19  1359      History   Chief Complaint Chief Complaint  Patient presents with  . STD Testing    HPI Jeffrey Mack is a 28 y.o. male.   HPI  Presents to urgent care for treatment of STD.  He states he received a call from a lady friend that she had an STD and that he needed to go get treated.  He states that she got a shot and pills at her office.  He is having some dysuria.  He would like to be treated.  He declines blood work in spite of my advice. History reviewed. No pertinent past medical history.  Patient Active Problem List   Diagnosis Date Noted  . Cardiomegaly 04/22/2015  . Chest pain at rest 04/22/2015    History reviewed. No pertinent surgical history.     Home Medications    Prior to Admission medications   Medication Sig Start Date End Date Taking? Authorizing Provider  naproxen (NAPROSYN) 500 MG tablet Take 1 tablet (500 mg total) by mouth 2 (two) times daily. 05/29/18   Georgetta Haber, NP    Family History Family History  Problem Relation Age of Onset  . Kidney disease Father   . Lupus Father   . Diabetes Maternal Grandfather   . Heart attack Paternal Grandfather     Social History Social History   Tobacco Use  . Smoking status: Current Every Day Smoker  Substance Use Topics  . Alcohol use: Yes  . Drug use: Yes    Types: Marijuana     Allergies   Patient has no known allergies.   Review of Systems Review of Systems  Constitutional: Negative for chills and fever.  HENT: Negative for ear pain and sore throat.   Eyes: Negative for pain and visual disturbance.  Respiratory: Negative for cough and shortness of breath.   Cardiovascular: Negative for chest pain and palpitations.  Gastrointestinal: Negative for abdominal pain and vomiting.  Genitourinary: Positive for dysuria. Negative for hematuria.  Musculoskeletal: Negative for arthralgias and back pain.   Skin: Negative for color change and rash.  Neurological: Negative for seizures and syncope.  All other systems reviewed and are negative.    Physical Exam Triage Vital Signs ED Triage Vitals  Enc Vitals Group     BP 10/15/19 1529 122/71     Pulse Rate 10/15/19 1529 75     Resp 10/15/19 1529 18     Temp 10/15/19 1529 98 F (36.7 C)     Temp Source 10/15/19 1529 Oral     SpO2 10/15/19 1529 94 %     Weight --      Height --      Head Circumference --      Peak Flow --      Pain Score 10/15/19 1530 0     Pain Loc --      Pain Edu? --      Excl. in GC? --    No data found.  Updated Vital Signs BP 122/71 (BP Location: Right Arm)   Pulse 75   Temp 98 F (36.7 C) (Oral)   Resp 18   SpO2 94%      Physical Exam Constitutional:      General: He is not in acute distress.    Appearance: He is well-developed.  HENT:     Head: Normocephalic and atraumatic.  Eyes:  Conjunctiva/sclera: Conjunctivae normal.     Pupils: Pupils are equal, round, and reactive to light.  Neck:     Musculoskeletal: Normal range of motion.  Cardiovascular:     Rate and Rhythm: Normal rate.  Pulmonary:     Effort: Pulmonary effort is normal. No respiratory distress.  Abdominal:     General: There is no distension.     Palpations: Abdomen is soft.  Genitourinary:    Penis: Normal.      Comments: Normal circumcised penis.  No rash or discharge.  Sample obtained Musculoskeletal: Normal range of motion.  Skin:    General: Skin is warm and dry.  Neurological:     Mental Status: He is alert.      UC Treatments / Results  Labs (all labs ordered are listed, but only abnormal results are displayed) Labs Reviewed  CYTOLOGY, (ORAL, ANAL, URETHRAL) ANCILLARY ONLY    EKG   Radiology No results found.  Procedures Procedures (including critical care time)  Medications Ordered in UC Medications  azithromycin (ZITHROMAX) tablet 1,000 mg (1,000 mg Oral Given 10/15/19 1601)  cefTRIAXone  (ROCEPHIN) injection 250 mg (250 mg Intramuscular Given 10/15/19 1602)  azithromycin (ZITHROMAX) 250 MG tablet (has no administration in time range)  cefTRIAXone (ROCEPHIN) 250 MG injection (has no administration in time range)  lidocaine (PF) (XYLOCAINE) 1 % injection (has no administration in time range)    Initial Impression / Assessment and Plan / UC Course  I have reviewed the triage vital signs and the nursing notes.  Pertinent labs & imaging results that were available during my care of the patient were reviewed by me and considered in my medical decision making (see chart for details).     Condom recommended Final Clinical Impressions(s) / UC Diagnoses   Final diagnoses:  Exposure to STD     Discharge Instructions     You have been treated for gonorrhea and chlamydia Testing has been sent to the laboratory You will be notified if any of your tests are positive Avoid sexual relations for 7 days after treatment   ED Prescriptions    None     PDMP not reviewed this encounter.   Raylene Everts, MD 10/15/19 [phone]

## 2019-10-17 LAB — CYTOLOGY, (ORAL, ANAL, URETHRAL) ANCILLARY ONLY
Chlamydia: NEGATIVE
Neisseria Gonorrhea: NEGATIVE
Trichomonas: NEGATIVE
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
## 2020-08-06 ENCOUNTER — Encounter (HOSPITAL_COMMUNITY): Payer: Self-pay

## 2020-08-06 ENCOUNTER — Other Ambulatory Visit: Payer: Self-pay

## 2020-08-06 ENCOUNTER — Ambulatory Visit (HOSPITAL_COMMUNITY)
Admission: EM | Admit: 2020-08-06 | Discharge: 2020-08-06 | Disposition: A | Discharge status: Home / Self Care | Payer: Self-pay | Attending: Family Medicine | Admitting: Family Medicine

## 2020-08-06 DIAGNOSIS — Z202 Contact with and (suspected) exposure to infections with a predominantly sexual mode of transmission: Secondary | ICD-10-CM | POA: Insufficient documentation

## 2020-08-06 MED ORDER — CEFTRIAXONE SODIUM 500 MG IJ SOLR
500.0000 mg | Freq: Once | INTRAMUSCULAR | Status: AC
  Administered 2020-08-06: 500 mg via INTRAMUSCULAR

## 2020-08-06 MED ORDER — AZITHROMYCIN 250 MG PO TABS
1000.0000 mg | ORAL_TABLET | Freq: Once | ORAL | Status: AC
  Administered 2020-08-06: 1000 mg via ORAL

## 2020-08-06 MED ORDER — CEFTRIAXONE SODIUM 500 MG IJ SOLR
INTRAMUSCULAR | Status: AC
  Filled 2020-08-06: qty 500

## 2020-08-06 MED ORDER — AZITHROMYCIN 250 MG PO TABS
ORAL_TABLET | ORAL | Status: AC
  Filled 2020-08-06: qty 4

## 2020-08-06 MED ORDER — STERILE WATER FOR INJECTION IJ SOLN
INTRAMUSCULAR | Status: AC
  Filled 2020-08-06: qty 10

## 2020-08-06 NOTE — Discharge Instructions (Signed)
You have been given the following today for treatment of suspected gonorrhea and/or chlamydia:  Meds ordered this encounter  Medications   azithromycin (ZITHROMAX) tablet 1,000 mg   cefTRIAXone (ROCEPHIN) injection 500 mg     Even though we have treated you today, we have sent testing for sexually transmitted infections. We will notify you of any positive results once they are received. If required, we will prescribe any medications you might need.  Please refrain from all sexual activity for at least the next seven days.

## 2020-08-06 NOTE — ED Triage Notes (Signed)
Pt presents for STD Testing after partner was treated for gonorrhea & chlamydia ; pt states he is not having any symptoms.

## 2020-08-08 LAB — CYTOLOGY, (ORAL, ANAL, URETHRAL) ANCILLARY ONLY
Chlamydia: NEGATIVE
Comment: NEGATIVE
Comment: NEGATIVE
Comment: NORMAL
Neisseria Gonorrhea: NEGATIVE
Trichomonas: NEGATIVE

## 2020-08-11 NOTE — ED Provider Notes (Signed)
[facility] CARE CENTER   [phone] 08/06/20 Arrival Time: 1909  ASSESSMENT & PLAN:  1. STD exposure       Discharge Instructions      You have been given the following today for treatment of suspected gonorrhea and/or chlamydia:  Meds ordered this encounter  Medications  . azithromycin (ZITHROMAX) tablet 1,000 mg  . cefTRIAXone (ROCEPHIN) injection 500 mg     Even though we have treated you today, we have sent testing for sexually transmitted infections. We will notify you of any positive results once they are received. If required, we will prescribe any medications you might need.  Please refrain from all sexual activity for at least the next seven days.     Pending: Labs Reviewed  CYTOLOGY, (ORAL, ANAL, URETHRAL) ANCILLARY ONLY    Will notify of any positive results. Instructed to refrain from sexual activity for at least seven days.  Reviewed expectations re: course of current medical issues. Questions answered. Outlined signs and symptoms indicating need for more acute intervention. Patient verbalized understanding. After Visit Summary given.   SUBJECTIVE:  Jeffrey Mack is a 29 y.o. male who requests STD testing; reports partner treated for gonorrhea and chlamydia. No symptoms.     OBJECTIVE:  Vitals:   08/06/20 2027  BP: 121/79  Pulse: 85  Resp: 20  Temp: 98.1 F (36.7 C)  TempSrc: Oral  SpO2: 100%     General appearance: alert, cooperative, appears stated age and no distress Throat: lips, mucosa, and tongue normal; teeth and gums normal Lungs: unlabored respirations; speaks full sentences without difficulty Back: no CVA tenderness; FROM at waist Abdomen: soft, non-tender GU: normal appearing genitalia Skin: warm and dry Psychological: alert and cooperative; normal mood and affect.    Labs Reviewed  CYTOLOGY, (ORAL, ANAL, URETHRAL) ANCILLARY ONLY    No Known Allergies  History reviewed. No pertinent past medical  history. Family History  Problem Relation Age of Onset  . Kidney disease Father   . Lupus Father   . Diabetes Maternal Grandfather   . Heart attack Paternal Grandfather    Social History   Socioeconomic History  . Marital status: Single    Spouse name: Not on file  . Number of children: Not on file  . Years of education: Not on file  . Highest education level: Not on file  Occupational History  . Not on file  Tobacco Use  . Smoking status: Current Every Day Smoker  Substance and Sexual Activity  . Alcohol use: Yes  . Drug use: Yes    Types: Marijuana  . Sexual activity: Yes    Birth control/protection: None  Other Topics Concern  . Not on file  Social History Narrative  . Not on file   Social Determinants of Health   Financial Resource Strain:   . Difficulty of Paying Living Expenses: Not on file  Food Insecurity:   . Worried About Programme researcher, broadcasting/film/video in the Last Year: Not on file  . Ran Out of Food in the Last Year: Not on file  Transportation Needs:   . Lack of Transportation (Medical): Not on file  . Lack of Transportation (Non-Medical): Not on file  Physical Activity:   . Days of Exercise per Week: Not on file  . Minutes of Exercise per Session: Not on file  Stress:   . Feeling of Stress : Not on file  Social Connections:   . Frequency of Communication with Friends and Family: Not on file  .  Frequency of Social Gatherings with Friends and Family: Not on file  . Attends Religious Services: Not on file  . Active Member of Clubs or Organizations: Not on file  . Attends Banker Meetings: Not on file  . Marital Status: Not on file  Intimate Partner Violence:   . Fear of Current or Ex-Partner: Not on file  . Emotionally Abused: Not on file  . Physically Abused: Not on file  . Sexually Abused: Not on file          Mardella Layman, MD 08/11/20 [phone]

## 2020-11-17 DIAGNOSIS — U071 COVID-19: Secondary | ICD-10-CM

## 2020-11-17 DIAGNOSIS — Z20822 Contact with and (suspected) exposure to covid-19: Secondary | ICD-10-CM | POA: Insufficient documentation

## 2020-11-17 HISTORY — DX: Contact with and (suspected) exposure to covid-19: Z20.822

## 2020-11-17 HISTORY — DX: COVID-19: U07.1

## 2021-09-21 DIAGNOSIS — M25551 Pain in right hip: Secondary | ICD-10-CM | POA: Insufficient documentation

## 2021-09-21 DIAGNOSIS — K219 Gastro-esophageal reflux disease without esophagitis: Secondary | ICD-10-CM | POA: Insufficient documentation

## 2021-09-21 DIAGNOSIS — S39011A Strain of muscle, fascia and tendon of abdomen, initial encounter: Secondary | ICD-10-CM

## 2021-09-21 DIAGNOSIS — M545 Low back pain, unspecified: Secondary | ICD-10-CM

## 2021-09-21 DIAGNOSIS — G8929 Other chronic pain: Secondary | ICD-10-CM | POA: Insufficient documentation

## 2021-09-21 HISTORY — DX: Strain of muscle, fascia and tendon of abdomen, initial encounter: S39.011A

## 2021-09-21 HISTORY — DX: Low back pain, unspecified: M54.50

## 2021-09-29 ENCOUNTER — Ambulatory Visit: Admit: 2021-09-29 | Discharge: 2021-09-29 | Payer: MEDICARE | Attending: Orthopaedic Surgery

## 2021-09-29 DIAGNOSIS — M545 Low back pain, unspecified: Secondary | ICD-10-CM

## 2021-09-29 MED ORDER — METHYLPREDNISOLONE 4 MG TABS IN A DOSE PACK
4 mg | ORAL | 0 refills | Status: AC
Start: 2021-09-29 — End: ?

## 2021-09-29 NOTE — Progress Notes (Signed)
Name: Chapman Matteucci    DOB: 1991/03/06     Service Dept: [facility]    Chief Complaint   Patient presents with    Hip Pain    Back Pain        Visit Vitals  Ht 6\' 1"  (1.854 m)   Wt 308 lb (139.7 kg)   BMI 40.64 kg/m??        No Known Allergies     Current Outpatient Medications   Medication Sig Dispense Refill    methylPREDNISolone (MEDROL DOSEPACK) 4 mg tablet Per dose pack instructions 1 Dose Pack 0    naproxen (NAPROSYN) 500 mg tablet TAKE 1 TABLET TWICE A DAY BY ORAL ROUTE FOR 90 DAYS.        There is no problem list on file for this patient.     Family History   Problem Relation Age of Onset    Cancer Father       Social History     Socioeconomic History    Marital status: UNKNOWN   Tobacco Use    Smoking status: Former     Types: Cigarettes    Smokeless tobacco: Current   Substance and Sexual Activity    Alcohol use: Yes     Alcohol/week: 7.0 standard drinks     Types: 7 Drinks containing 0.5 oz of alcohol per week      History reviewed. No pertinent surgical history.   History reviewed. No pertinent past medical history.     I have reviewed and agree with PFSH and ROS and intake form in chart and the record furthermore I have reviewed prior medical record(s) regarding this patients care during this appointment.     Review of Systems:   Patient is a pleasant appearing individual, appropriately dressed, well hydrated, well nourished, who is alert, appropriately oriented for age, and in no acute distress with a normal gait and normal affect who does not appear to be in any significant pain.    Physical Exam:  The patient's back is neurovascularly intact and appears to have good symmetry on exam with no muscle atrophy noted. Normal curvature of the spine with positive tenderness to palpation. Decreased range of motion in all planes secondary to pain but normal strength. No rashes, echymosis or other skin lesions noted.     The patients bilateral lower extremities  are grossly neurovascularly intact with good cap refill, full range of motion and strength. No swelling, echymosis or instability noted. Negative straight leg raise, negative thompson's and negative lachman's. No tenderness to palpation. No foot drop present and patient has a normal gait.    Encounter Diagnoses     ICD-10-CM ICD-9-CM   1. Low back pain, unspecified back pain laterality, unspecified chronicity, unspecified whether sciatica present  M54.50 724.2       HPI:  The patient is here with low back pain, throbbing burning pain.  Continues to have difficulty with it as well.    X-rays of the low back are positive for osteoarthritis.    Assessment/Plan:  Plan at this point, we will have him do physical therapy and Medrol Pak.  If he is not better, we will get a lumbar spine MRI and go from there.  If he gets worse, he is to give me a call.      As part of continued conservative pain management options the patient was advised to utilize Tylenol or OTC NSAIDS as long as it is  not medically contraindicated.     Return to Office:   Follow-up and Dispositions    Return in about 4 weeks (around 10/27/2021).           Scribed by Diona Foley, LPN as dictated by [facility] Hights A. Allena Katz, MD.  Documentation True and Accepted Hayli Milligan A. Allena Katz, MD

## 2021-10-12 ENCOUNTER — Inpatient Hospital Stay: Admit: 2021-10-12 | Payer: BLUE CROSS/BLUE SHIELD

## 2021-10-12 DIAGNOSIS — M545 Low back pain, unspecified: Secondary | ICD-10-CM

## 2021-10-12 NOTE — Other (Signed)
Therapy Evaluation by Leavy Cella, PT at 10/12/21 1300                Author: Leavy Cella, PT  Service: Physical Therapy  Author Type: Physical Therapist       Filed: 10/12/21 1535  Date of Service: 10/12/21 1300  Status: Signed           Editor: Leavy Cella, PT (Physical Therapist)  Cosigner: Eulis Canner, MD at 10/13/21 [phone]               [facility]   [address]   [place], [place]  [address]   PLAN OF CARE / STATEMENT  OF MEDICAL NECESSITY FOR PHYSICAL THERAPY SERVICES        Patient Name:  Nicholas Reed  DOB:  1990/11/27          Medical    Diagnosis:  Low back pain, unspecified [M54.50]  Treatment Diagnosis:  Lumbar radiculopathy; M62.81          Onset Date:  ~04/11/21              Referral Source:  Eulis Canner, MD  Start of Care [facility]):  10/12/2021          Prior Hospitalization:  See medical history  Provider #:  [id number]        Prior Level of Function:  independent        Comorbidities:  arthritis        Medications:  Verified on Patient Summary List     The Plan of Care and following information is based on the information from the initial evaluation.    ==========================================================================================   Assessment / Functional Analysis:     Pt is 30 y/o male who presents to physical therapy with LBP of insidious onset and radiculopathy of 6 months.  Pt reports lumbar pain increases with lifting.  Pt works at [employer] and has to lift boxes frequently at work.  Pt reports he has lost 50 pounds  since May when he changed his diet and started walking regularly.  Pt presents today with decreased lumbar ROM, increased pain, TTP lumbar paraspinals, gluts, and piriformis, and decreased lumbar joint mobility, limiting Pt ability to perform functional  activities of choice. Pt could benefit from skilled PT services to address above impairments and to increase Pt ability to return to functional activities of  choice.       ==========================================================================================   Eval Complexity: History: MEDIUM  Complexity : 1-2 comorbidities / personal factors will impact the outcome/ POC  Exam:LOW Complexity : 1-2 Standardized tests and measures addressing body structure, function, activity limitation and / or participation in recreation   Presentation: LOW Complexity : Stable, uncomplicated  Clinical Decision Making:MEDIUM Complexity  : FOTO score of 26-74Overall Complexity:MEDIUM      Problem List: pain affecting function, decrease ROM, decrease strength, decrease activity tolerance, and decrease flexibility/ joint mobility    Treatment Plan may include any combination of the following: Therapeutic exercise, Neuromuscular reeducation, Manual therapy, Therapeutic activity,  Self care/home management, Electric stim unattended , Vasopneumatic device, and Gait training   Patient / Family readiness to learn indicated by: asking questions, trying to perform skills, and interest   Persons(s) to be included in education: patient (P)   Barriers to Learning/Limitations: None         Patient self reported health status: good   Rehabilitation Potential: good      Objective Measures:  ODI Score: 8.3   FOTO: 69      Active Movements: []   N/A   []   Too acute   []   Other:        ROM  % AROM  % PROM  Comments:pain, area          Forward flexion 40-60  100                Extension 20-30  75                SB right 20-30  100     Increased sx     SB left 20-30  100           Rotation right 5-10  100                Rotation left 5-10  100                                                                   AROM                       PROM                         MMT                  Left  Right  Left  Right  Left  Right     Hip  Flexion              4+/5  4-/5        Extension              5/5  5/5        Abduction              5/5  4+/5        Adduction              5/5  4+/5        Internal Rotation               5/5  4+/5        External rotation              5/5  4/5     Knee  Flexion              5/5  4/5        Extension              5/5  4/5     Ankle  Dorsiflexion              5/5  5/5                 Plantarflexion              5/5  5/5            Short Term Goals: 3 weeks   1.  Pt will report compliance and demonstrate ability to correctly perform HEP exercises in 3 weeks.    2.  Pt will report lumbar and cervical pain < 2/10 when performing all functional activity in 3 weeks.  3.  Pt will increase BLE strength to 4+/5 MMT to facilitate patient's ability to perform community mobility in order to perform functional mobility with reduced risk of falls and return to PLOF.        Long Term Goals: 6 weeks   1.  Pt will report 0/10 pain in lumbar and cervical spine when performing all functional activity in 6 weeks.    2.  Pt will increase BLE strength to 5/5 MMT to facilitate patient's ability to perform community mobility in order to perform functional mobility with reduced risk of falls and return to PLOF.     3.  Pt will increase gross lumbar AROM to 100% to be able to perform leisure activities of choice in 6 weeks.          Frequency / Duration: Patient to be seen  2  times per week for 6   weeks:   Patient / Caregiver education and instruction: self care, activity modification, and exercises           Therapist Signature:  Leavy Cella, PT, DPT  Date:  10/12/2021          Certification Period:  10/12/21 - 01/12/22  Time:  3:18 PM     ===========================================================================================   I certify that the above Physical Therapy Services are being furnished while the patient is under my care.  I agree with the treatment plan and certify that this therapy is necessary.      Physician Signature:        Date:        Time:      Please sign and return to [facility] PT or you may fax the signed copy to [phone]. Please call [phone] if more information required. Thank  you.

## 2021-10-12 NOTE — Other (Signed)
Therapy Evaluation by Leavy Cella, PT at 10/12/21 1300                Author: Leavy Cella, PT  Service: Physical Therapy  Author Type: Physical Therapist       Filed: 10/12/21 1517  Date of Service: 10/12/21 1300  Status: Signed          Editor: Leavy Cella, PT (Physical Therapist)               PT DAILY TREATMENT NOTE/LUMBAR EVAL 10-18      Patient Name: Nicholas Reed   Date:10/12/2021   DOB: 04-06-1991     Patient DOB Verified   Payor: BLUE CROSS MEDICARE / Plan: [place] BLUE [place]OSS MEDICARE PPO / Product Type: Managed Care Medicare /     In time: 1310  Out time: 1345   Total Treatment Time (min): 35   Visit #: 1        Medicare/BCBS Only        Total Timed Codes (min):  0  1:1 Treatment Time:  35        Treatment Area: Low back pain, unspecified [M54.50]      Pain Level (0-10 scale): Current: 2/10;  At worst: 10/10;  At best: 0/10   Sharp  Dull   Achy Burning Throbbing  N[employer]T Other:   constant intermittent  improving worsening no change since onset      Any medication changes, allergies to medications, adverse drug reactions, diagnosis change, or new procedure performed?:   No     Yes (see summary sheet for update)   Subjective:      Pt is 30 y/o male who presents to physical therapy with LBP of insidious onset and radiculopathy of 6 months.  Pt reports lumbar pain increases with lifting.  Pt works at [employer] and has to lift boxes frequently at work.  Pt reports he has lost 50 pounds  since May when he changed his diet and started walking regularly.  Pt presents today with decreased lumbar ROM, increased pain, TTP lumbar paraspinals, gluts, and piriformis, and decreased lumbar joint mobility, limiting Pt ability to perform functional  activities of choice. Pt could benefit from skilled PT services to address above impairments and to increase Pt ability to return to functional activities of choice.          Patient Goals: Reduce pain, restore mobility   Previous  Treatment/Compliance: NA   Barriers: pain financial  time transportation other   Motivation: Good   Substance use: Alcohol Tobacco  other:    ODI Score: 8.3   FOTO: 69         Symptoms:   Aggravated by:     Bending  Sitting   Standing  Walking     Moving  Cough   Sneeze  Valsalva     AM   PM  Lying:    sup    pro    sidelying     Other:  frequent lifting       Eased by:      Bending  Sitting   Standing  Walking     Moving  AM    PM  Lying:  sup   pro    sidelying     Other: medication, steroids, heat, rest       General Health:  Red Flags Indicated?   Yes     No    Yes  No Recent weight change (If yes, due  to dieting? []   Yes  []  No)    [x]  Yes []  No Weakness in legs during walking   []  Yes [x]  No Unremitting pain at night   []  Yes [x]  No Abdominal pain or problems   []  Yes [x]  No Rectal bleeding   []  Yes [x]  No Feet more cold or painful in cold weather   []  Yes [x]  No Menstrual irregularities   []  Yes [x]  No Blood or pain with urination   []  Yes [x]  No Dysfunction of bowel or bladder   []  Yes [x]  No Recent illness within past 3 weeks (i.e, cold, flu)   []  Yes [x]  No Numbness/tingling in buttock/genitalia region      Past History/Treatments:    No past medical history on file.       Diagnostic Tests: []   Lab work []  X-rays    []  CT []   MRI     []  Other:   Results:      Functional Status   Prior level of function: independent   Present functional limitations: none   What position do you sleep in?:  prone      OBJECTIVE      Gait:  [x]   Normal     []  Abnormal:      Active Movements: []  N/A   []  Too acute   []   Other:        ROM  % AROM  % PROM  Comments:pain, area          Forward flexion 40-60  100              Extension 20-30  75              SB right 20-30  100    Increased sx     SB left 20-30  100         Rotation right 5-10  100              Rotation left 5-10  100            Repeated Movements    Effects on present pain: produces (PR),  abolishes (A), increases (incr), decreases (decr), centralizes (C), peripheral (PH), no effect (NE)              Pre-Test Sx  Flexion  Repeated Flexion  Extension  Repeated Extension  Repeated SBL  Repeated SBR              Sitting                   Standing                            Lying            N/A  N/A     Comments:   Side Glide:   Sustained passive positioning test:      Neuro Screen []   WNL   Myotome/Dermatome/Reflexes: WNL   Comments:      Dural Mobility:   SLR Sitting: []   R    []  L    []  +    []   -  @ (degrees):            Supine: []  R    []  L    []   +    []  -  @ (degrees):    Slump Test: [x]  R    []  L    [  x]  +    []  -  @ (degrees):    Prone Knee Bend: []  R    []  L    []   +    []  -                   AROM                       PROM    MMT                Left  Right  Left  Right  Left  Right     Hip  Flexion          4+/5  4-/5       Extension          5/5  5/5       Abduction          5/5  4+/5       Adduction          5/5  4+/5       Internal Rotation          5/5  4+/5       External rotation          5/5  4/5     Knee  Flexion          5/5  4/5       Extension          5/5  4/5     Ankle  Dorsiflexion          5/5  5/5                Plantarflexion          5/5  5/5        Special Tests   Lumbar:  Lumb. Compression: [x]   Pos  []  Neg                Lumbar Distraction:   []  Pos  []  Neg     Quadrant:  []  Pos  []  Neg   []   Flex  []  Ext      Sacroilliac:  Gaenslen's: []   R    []  L    []  +    []   -      Compression: []  +    []  -      Gapping:  []  +    []  -      Thigh Thrust: []  R    []  L    []   +    []  -      Leg Length: []  +    []  -   Position:     Crests:     ASIS:     PSIS:     Sacral Sulcus:     Mobility: Standing flex:      Sitting flex:      Supine to sit:      Prone knee bend:           Hip: Pearlean Brownie:  []   R    []  L    []  +    []   -      Scour:  []  R    []  L    []   +    []  -      Piriformis: [x]  R    [x]  L    []   +    []  -  Deficits: Ober's: []   R    []  L    []  +    []   -      Thomas: []  R     []  L    []   +    []  -      Hamstrings 90/90: positive B     Gastrocsoleus (to neutral): Right: Left:         Global Muscular Weakness:   Abdominals:WNL   Quadratus Lumborum: generalized weakness   Paraspinals: generalized weakness    Other:         Modality rationale:           Min  Type  Additional Details           []  Estim:   [] Unatt       [] IFC  []  Premod                         []  Other:   [] w/ice    [] w/heat   Position:   Location:           []  Estim: [] Att    []  TENS instruct  [] NMES                     []  Other:   [] w/US    [] w/ice   [] w/heat   Position:   Location:           []   Traction: []  Cervical       []  Lumbar                        []  Prone          [] Supine                        [] Intermittent   [] Continuous  Lbs:   []   before manual   []   after manual           []   Ultrasound: [] Continuous   []   Pulsed                            []   []  Location:   W/cm2:       []   Iontophoresis  with dexamethasone          Location:  []   Take home patch    []   In clinic           []   Ice      []   heat   []   Ice massage   []   Laser    []   Anodyne  Position:   Location:           []   Laser  with stim   []   Other:  Position:   Location:           []   Vasopneumatic  Device  Pressure:       []   lo []  med []  hi    Temperature: []  lo []  med []   hi     []  Skin assessment post-treatment:   [] intact [] redness- no adverse reaction     []  redness - adverse reaction:           35  min  [x] Eval                   [] Re-Eval  With    []   TE    []  TA    []  neuro    []  other:  Patient Education: [x]   Review HEP     []  Progressed/Changed HEP based on:    []  positioning   []  body mechanics   []   transfers   []  heat/ice application     []  other:             Pain Level (0-10 scale) post treatment: 2/10      Assessment:       [x]   See Plan of Care   []   See progress note/recertification   []    See Discharge Summary              , PT, DPT  10/12/2021  1:10 PM

## 2021-10-15 ENCOUNTER — Inpatient Hospital Stay: Admit: 2021-10-15 | Payer: BLUE CROSS/BLUE SHIELD

## 2021-10-15 DIAGNOSIS — M545 Low back pain, unspecified: Secondary | ICD-10-CM

## 2021-10-15 NOTE — Progress Notes (Signed)
 PT DAILY TREATMENT NOTE 8-14    Patient Name: Nicholas Reed  Date:10/15/2021  DOB: 1991/07/08  [x]   Patient DOB Verified  Payor: BLUE CROSS / Plan: [facility] [place]  PPO / Product Type: PPO /    In time:1403  Out time:1500  Total Treatment Time (min): 57  Total Timed Codes (min): 57  1:1 Treatment Time (min): 57   Visit #: 2     Treatment Area: Low back pain, unspecified [M54.50]    SUBJECTIVE  Pt relates that his back is stiff and he has more discomfort in his right leg from time to time.     Pain Level (0-10 scale): 0    Any medication changes, allergies to medications, adverse drug reactions, diagnosis change, or new procedure performed?: [x]  No    []  Yes (see summary sheet for update)        OBJECTIVE  Modality rationale: Patient refused.   Min Type Additional Details    []  Estim: [] Att   [] Unatt  [] TENS instruct                 [] IFC  [] Premod [] NMES                       [] Other:  [] w/US    [] w/ice   [] w/heat  Position:  Location:    []   Traction: []  Cervical       [] Lumbar                       []  Prone          [] Supine                       [] Intermittent   [] Continuous Lbs:  []  before manual  []  after manual    []   Ultrasound: [] Continuous   []  Pulsed                           []   [] Location:  W/cm2:    []   Iontophoresis with dexamethasone         Location: []  Take home patch   []  In clinic    []   Ice     []   heat  []   Ice massage Position:  Location:    []   Vasopneumatic Device Pressure: []  lo []  med []  hi   Temp: []  lo []  med []  hi   []  Skin assessment post-treatment:  [] intact [] redness- no adverse reaction       [] redness - adverse reaction:           57 min Therapeutic Exercise:  [x]  See flow sheet :   Rationale: increase ROM and increase strength to improve the patient's ability to return to prior level of function before injury/illness with reduced pain, achieving optimal strength and function to perform household tasks, daily activities, and return to community events, and/or  work.       min Therapeutic Activity:  []   See flow sheet :   Rationale:       min Neuromuscular Re-education:  []   See flow sheet :   Rationale:      min Manual Therapy:     Rationale:       min Gait Training:  ___ feet with ___ device on level surfaces with ___ level of assist   Rationale:  With TE  TA   NR  GT   Misc Patient Education: [x]  Review HEP    []  Progressed/Changed HEP based on:   []  positioning   []  body mechanics   []  transfers   []  heat/ice application          Pain Level (0-10 scale) post treatment: 0    ASSESSMENT/Changes in Function: Session began on stepper for active warm up. Review of HEP including supine bridging with PPT, Clams with graded resistance band (green) and open books. No adverse reaction with the for mentioned exercises. Attempted SLR and sciatic nerve glides with pt unable to perform secondary to increased pain. Standing exercise of mini squats with demonstration provided and verbal cues with technique as needed. Lateral walks in // bars with no resistance.  Cued pt not to take a wide step as this was increasing his pain. Introduced seated ball roll outs with large physioball in effort to reduce muscle tightness of the thoracic and lumbar spine. Pt refused modalities. Continue with POC.     Patient will continue to benefit from skilled PT services to modify and progress therapeutic interventions, address functional mobility deficits, address ROM deficits, address strength deficits, analyze and address soft tissue restrictions, analyze and cue movement patterns, analyze and modify body mechanics/ergonomics, and assess and modify postural abnormalities to attain remaining goals.     [x]   See Plan of Care  []   See progress note/recertification  []   See Discharge Summary           PLAN  []   Upgrade activities as tolerated     [x]   Continue plan of care  []   Update interventions per flow sheet       []   Discharge due to:_  []   Other:_      Almarie Candance Laine ,  LPTA 10/15/2021  3:15 PM

## 2021-10-20 ENCOUNTER — Inpatient Hospital Stay
Admit: 2021-10-20 | Discharge: 2021-10-20 | Disposition: A | Discharge status: Home / Self Care | Payer: BLUE CROSS/BLUE SHIELD | Attending: Emergency Medicine

## 2021-10-20 DIAGNOSIS — R197 Diarrhea, unspecified: Secondary | ICD-10-CM

## 2021-10-20 MED ORDER — ONDANSETRON 4 MG TAB, RAPID DISSOLVE
4 mg | ORAL_TABLET | Freq: Three times a day (TID) | ORAL | 0 refills | Status: AC | PRN
Start: 2021-10-20 — End: ?

## 2021-10-20 MED ORDER — FAMOTIDINE 20 MG TAB
20 mg | ORAL_TABLET | Freq: Every day | ORAL | 0 refills | Status: AC
Start: 2021-10-20 — End: 2021-10-30

## 2021-10-20 MED ORDER — ONDANSETRON 4 MG TAB, RAPID DISSOLVE
4 mg | ORAL | Status: AC
Start: 2021-10-20 — End: 2021-10-20
  Administered 2021-10-20: 10:00:00 via ORAL

## 2021-10-20 MED ORDER — FAMOTIDINE 20 MG TAB
20 mg | ORAL | Status: AC
Start: 2021-10-20 — End: 2021-10-20
  Administered 2021-10-20: 10:00:00 via ORAL

## 2021-10-20 MED ORDER — LOPERAMIDE 2 MG CAP
2 mg | Freq: Once | ORAL | Status: AC
Start: 2021-10-20 — End: 2021-10-20
  Administered 2021-10-20: 10:00:00 via ORAL

## 2021-10-20 MED FILL — IMODIUM A-D 2 MG CAPSULE: 2 mg | ORAL | Qty: 1

## 2021-10-20 MED FILL — ONDANSETRON 4 MG TAB, RAPID DISSOLVE: 4 mg | ORAL | Qty: 1

## 2021-10-20 MED FILL — FAMOTIDINE 20 MG TAB: 20 mg | ORAL | Qty: 1

## 2021-10-20 NOTE — ED Provider Notes (Signed)
Pt c/o n/v/d x 4 days, vomiting less, none in 24 hours, but freq watery stool, says dark since dried pepco yesterday.   No fver. + crampy abd pain, mild. No fever.  Mild non prod cough. No weakness. No fever. Daugher at home w + flu dx.  Pt w no pmh.        History reviewed. No pertinent past medical history.    History reviewed. No pertinent surgical history.      Family History:   Problem Relation Age of Onset    Cancer Father        Social History     Socioeconomic History    Marital status: SINGLE     Spouse name: Not on file    Number of children: Not on file    Years of education: Not on file    Highest education level: Not on file   Occupational History    Not on file   Tobacco Use    Smoking status: Former     Types: Cigarettes    Smokeless tobacco: Current   Substance and Sexual Activity    Alcohol use: Yes     Alcohol/week: 7.0 standard drinks     Types: 7 Drinks containing 0.5 oz of alcohol per week    Drug use: Not on file    Sexual activity: Not on file   Other Topics Concern    Not on file   Social History Narrative    Not on file     Social Determinants of Health     Financial Resource Strain: Not on file   Food Insecurity: Not on file   Transportation Needs: Not on file   Physical Activity: Not on file   Stress: Not on file   Social Connections: Not on file   Intimate Partner Violence: Not on file   Housing Stability: Not on file         ALLERGIES: Patient has no known allergies.    Review of Systems   Constitutional:  Negative for diaphoresis and fever.   HENT:  Negative for congestion.    Respiratory:  Negative for cough and shortness of breath.    Cardiovascular:  Negative for chest pain.   Gastrointestinal:  Positive for abdominal pain, diarrhea, nausea and vomiting.   Musculoskeletal:  Negative for back pain.   Skin:  Negative for rash.   Neurological:  Negative for dizziness.   All other systems reviewed and are negative.    Vitals:    10/20/21 0401   BP: 130/70   Pulse: 76   Resp: 20   Temp:  98.2 ??F (36.8 ??C)   SpO2: 97%   Weight: 139.7 kg (308 lb)   Height: 6' 1"  (1.854 m)            Physical Exam  Vitals and nursing note reviewed.   Constitutional:       Appearance: He is well-developed.   HENT:      Head: Normocephalic and atraumatic.   Eyes:      Conjunctiva/sclera: Conjunctivae normal.   Cardiovascular:      Rate and Rhythm: Normal rate and regular rhythm.   Pulmonary:      Effort: Pulmonary effort is normal.      Breath sounds: No wheezing.   Abdominal:      Palpations: Abdomen is soft.      Tenderness: There is abdominal tenderness (mild diffuse). There is no guarding.      Hernia: No hernia  is present.   Musculoskeletal:         General: No tenderness.      Cervical back: Normal range of motion.   Skin:     General: Skin is warm and dry.      Capillary Refill: Capillary refill takes less than 2 seconds.      Findings: No rash.   Neurological:      Mental Status: He is alert and oriented to person, place, and time.   Psychiatric:         Mood and Affect: Mood normal.        MDM         Procedures    Vitals:  Patient Vitals for the past 12 hrs:   Temp Pulse Resp BP SpO2   10/20/21 0401 98.2 ??F (36.8 ??C) 76 20 130/70 97 %         Medications ordered:   Medications   loperamide (IMODIUM) capsule 2 mg (2 mg Oral Given 10/20/21 0437)   ondansetron (ZOFRAN ODT) tablet 4 mg (4 mg Oral Given 10/20/21 0436)   famotidine (PEPCID) tablet 20 mg (20 mg Oral Given 10/20/21 0437)         Lab findings:  No results found for this or any previous visit (from the past 12 hour(s)).        X-Ray, CT or other radiology findings or impressions:  No orders to display             Progress notes, Consult notes or additional Procedure notes:   5:13 AM pt feels much better, no nausea or pain, abd soft and non tender, req dc at this time. No emc. Stable for dc and close f/up. Not c/w acute abd/intract vomiting/sepsis.  Det ret inst given.       Diagnosis:   1. Diarrhea, unspecified type    2. Nausea    3. Abdominal pain,  generalized        Disposition: home    Follow-up Information       Follow up With Specialties Details Why Contact Info    [facility] EMERGENCY DEPT Emergency Medicine Go to  As needed, If symptoms worsen [place]  [phone]    Rosalene Billings, NP Nurse Practitioner Schedule an appointment as soon as possible for a visit in 2 days  [address]  [address]  [place] [address]  [phone]               Patient's Medications   Start Taking    FAMOTIDINE (PEPCID) 20 MG TABLET    Take 1 Tablet by mouth daily for 10 days.    ONDANSETRON (ZOFRAN ODT) 4 MG DISINTEGRATING TABLET    Take 1 Tablet by mouth every eight (8) hours as needed for Nausea or Vomiting.   Continue Taking    METHYLPREDNISOLONE (MEDROL DOSEPACK) 4 MG TABLET    Per dose pack instructions    NAPROXEN (NAPROSYN) 500 MG TABLET    TAKE 1 TABLET TWICE A DAY BY ORAL ROUTE FOR 90 DAYS.   These Medications have changed    No medications on file   Stop Taking    No medications on file

## 2021-10-20 NOTE — ED Notes (Signed)
Reports 4 days of abdominal cramping and diarrhea. 2 days of vomiting. Reports stool is black/dark in color. Has been taking Pepto Bismol and other OTC meds with minimal relief. Daughter has been positive for influenza patient tested negative same day.

## 2021-10-20 NOTE — ED Notes (Signed)
I have reviewed discharge instructions with the patient.  The patient verbalized understanding.         Reviewed medication compliance, follow up with PCP, return to ER for any new or worrisome concerns

## 2021-10-21 ENCOUNTER — Inpatient Hospital Stay: Admit: 2021-10-21 | Payer: BLUE CROSS/BLUE SHIELD

## 2021-10-21 NOTE — Progress Notes (Signed)
PT DAILY TREATMENT NOTE 8-14    Patient Name: Nicholas Reed  Date:10/21/2021  DOB: 1991-07-10  [x]   Patient DOB Verified  Payor: BLUE CROSS / [place]: [place] / Product Type: PPO /    In time:806  Out time:856  Total Treatment Time (min): 50  Total Timed Codes (min): 50  1:1 Treatment Time (min): 50   Visit #: 3 of 30    Treatment Area: Low back pain, unspecified [M54.50]    SUBJECTIVE  Pt reported that pain is not too bad unless he has been on feet all day at work or with a lot of movement. Also has c/o groin pain worse than low back.     Pain Level (0-10 scale): 2/10    Any medication changes, allergies to medications, adverse drug reactions, diagnosis change, or new procedure performed?: [x]  No    []  Yes (see summary sheet for update)        OBJECTIVE  Modality rationale: NA today   Min Type Additional Details    []  Estim: [] Att   [] Unatt  [] TENS instruct                 [] IFC  [] Premod [] NMES                       [] Other:  [] w/US   [] w/ice   [] w/heat  Position:  Location:    []   Traction: []  Cervical       [] Lumbar                       []  Prone          [] Supine                       [] Intermittent   [] Continuous Lbs:  []  before manual  []  after manual    []   Ultrasound: [] Continuous   []  Pulsed                           []   [] Location:  W/cm2:    []   Iontophoresis with dexamethasone         Location: []  Take home patch   []  In clinic    []   Ice     []   heat  []   Ice massage Position:  Location:    []   Vasopneumatic Device Pressure: []  lo []  med []  hi   Temp: []  lo []  med []  hi   []  Skin assessment post-treatment:  [] intact [] redness- no adverse reaction       [] redness - adverse reaction:           50 min Therapeutic Exercise:  [x]  See flow sheet :   Rationale: increase ROM, increase strength, and improve balance to improve the patient's ability to return to prior level of function before injury/illness with reduced pain, achieving optimal strength and function to perform  household tasks, daily activities, and return to community events, and/or work.        min Therapeutic Activity:  []   See flow sheet :   Rationale:       min Neuromuscular Re-education:  []   See flow sheet :        min Manual Therapy:          min Gait Training:  ___ feet with ___ device on level surfaces  with ___ level of assist   Rationale:           With TE  TA   NR  GT   Misc Patient Education: [x]  Review HEP    []  Progressed/Changed HEP based on:   []  positioning   []  body mechanics   []  transfers   []  heat/ice application          Pain Level (0-10 scale) post treatment: 2/10    ASSESSMENT/Changes in Function: Began session with attempt to ride LBE, changed to stepper secondary to groin pain. Followed by seated and supine stretching as noted per flow sheet. Pt performed more reps than noted today with some exercises as time count was misunderstood but performed tham with good technique. Plan to resume other exercises next session. Cont POC.     Patient will continue to benefit from skilled PT services to modify and progress therapeutic interventions, address functional mobility deficits, address ROM deficits, and analyze and cue movement patterns to attain remaining goals.     [x]   See Plan of Care  []   See progress note/recertification  []   See Discharge Summary           PLAN  [x]   Upgrade activities as tolerated     [x]   Continue plan of care  []   Update interventions per flow sheet       []   Discharge due to:_  []   Other:_      Ernest Pine, PTA   10/21/2021  8:28 AM

## 2021-10-25 ENCOUNTER — Emergency Department: Admit: 2021-10-26 | Payer: BLUE CROSS/BLUE SHIELD

## 2021-10-25 DIAGNOSIS — Z113 Encounter for screening for infections with a predominantly sexual mode of transmission: Secondary | ICD-10-CM

## 2021-10-25 DIAGNOSIS — L03115 Cellulitis of right lower limb: Secondary | ICD-10-CM

## 2021-10-25 NOTE — ED Notes (Signed)
I have reviewed discharge instructions with the patient.  The patient verbalized understanding. Patient encouraged to return to ER with any other concerns or emergent care needed. Patient escorted to waiting room with no distress noted.

## 2021-10-25 NOTE — ED Notes (Signed)
 Patient states he was bit by something on his right knee and had a blister to center of knee for past 2 days. Reports blister popped and now patient states his right knee is swelling and painful. Patient also request STD check due to having white discharge from his penis.

## 2021-10-25 NOTE — ED Provider Notes (Signed)
ED Provider Notes by Mindi Curling, DO at 10/25/21 2121                Author: Mindi Curling, DO  Service: Emergency Medicine  Author Type: Physician       Filed: 10/26/21 0628  Date of Service: 10/25/21 2121  Status: Signed          Editor: Mindi Curling, DO (Physician)               EMERGENCY DEPARTMENT HISTORY AND PHYSICAL EXAM           Date: 10/25/2021   Patient Name: Nicholas Reed        History of Presenting Illness          Chief Complaint       Patient presents with        ?  Knee Swelling             Right              History Provided By: Patient      HPI: 58, 30 y.o. male with a past medical history significant  hip pain, back pain  presents to the ED with cc of knee pain  and STI check. Patient states he was bit by something on his right knee and had a blister to center of knee for past 2 days. Reports blister "popped" and now patient states his right knee is swelling and painful. Patient also request STD check due to  having white discharge from his penis. Patient denies fevers, chills, cp, sob, abdominal pain, n/v/d. No changes in diet or recent travel. When physician and nurse are in room, patient denies having discharge or exposure to STDs, he just wants to be screened  because he is staying in a hotel and his significant other is here to be checked too, there are bugs in the hotel room.       There are no other complaints, changes, or physical findings at this time.      PCP: None        No current facility-administered medications on file prior to encounter.          Current Outpatient Medications on File Prior to Encounter          Medication  Sig  Dispense  Refill           ?  methylPREDNISolone (MEDROL DOSEPACK) 4 mg tablet  Per dose pack instructions  1 Dose Pack  0     ?  ondansetron (ZOFRAN ODT) 4 mg disintegrating tablet  Take 1 Tablet by mouth every eight (8) hours as needed for Nausea or Vomiting. (Patient not taking: Reported on 10/25/2021)  14  Tablet  0           ?  famotidine (Pepcid) 20 mg tablet  Take 1 Tablet by mouth daily for 10 days. (Patient not taking: Reported on 10/25/2021)  10 Tablet  0             Past History        Past Medical History:   History reviewed. No pertinent past medical history.      Past Surgical History:   History reviewed. No pertinent surgical history.      Family History:     Family History         Problem  Relation  Age of Onset          ?  Cancer  Father             Social History:     Social History          Tobacco Use         ?  Smoking status:  Former              Types:  Cigarettes         ?  Smokeless tobacco:  Current       Substance Use Topics         ?  Alcohol use:  Yes              Alcohol/week:  7.0 standard drinks              Types:  7 Drinks containing 0.5 oz of alcohol per week           Allergies:   No Known Allergies           Review of Systems     CONSTITUTIONAL: Denies weight loss, fever and chills.   HEENT: Denies changes in vision and hearing.   RESPIRATORY: Denies SOB and cough.   CV: Denies palpitations and CP.    GI: Denies abdominal pain, nausea, vomiting and diarrhea.   GU: Denies dysuria and urinary frequency. +penile discharge   MSK: +knee pain and swelling   SKIN: Denies rash and pruritus.   NEUROLOGICAL: Denies headache and syncope.   PSYCHIATRIC: Denies recent changes in mood. Denies anxiety and depression.   Review of Systems        Physical Exam     GENERAL: Afebrile. Alert and oriented x 3. No acute distress. Well-nourished.  EYES: EOMI. Anicteric.  HENT: Moist mucous membranes. No scleral icterus. No cervical lymphadenopathy.   LUNGS: Chest rise and fall symmetric. O2 sat 98% on room air. Clear to auscultation bilaterally. No accessory muscle use.  CARDIOVASCULAR: Regular rate and rhythm. No murmur. No JVD.  ABDOMEN: Soft, non-tender and non-distended. No palpable masses.   EXTREMITIES: Right knee with anterior erythema, edema, tenderness to palpation. Central furuncle with surrounding  cellulitis. Negative homan's sign bilaterally.  SKIN: As stated above.  NEUROLOGIC: No focal neurological deficits. CN II-XII grossly  intact bilaterally.  PSYCHIATRIC: Cooperative. Appropriate mood and affect.   Physical Exam        Lab and Diagnostic Study Results        Labs -    No results found for this or any previous visit (from the past 12 hour(s)).      Radiologic Studies -    @lastxrresult @     CT Results  (Last 48 hours)             None                    CXR Results  (Last 48 hours)             None                          Medical Decision Making     - I am the first provider for this patient.      - I reviewed the vital signs, available nursing notes, past medical history, past surgical history, family history and social history.      - Initial assessment performed. The patients presenting problems have been discussed, and they are in agreement with the care plan formulated and outlined  with them.  I have encouraged them to ask questions as they arise throughout their visit.      Vital Signs-Reviewed the patient's vital signs.   Patient Vitals for the past 12 hrs:            Temp  Pulse  Resp  BP  SpO2            10/25/21 2342  --  82  16  130/76  100 %            10/25/21 2121  98.7 ??F (37.1 ??C)  86  18  137/71  100 %           Records Reviewed: Nursing Notes and Old Medical Records      The patient presents with knee pain, swelling with a differential diagnosis of effusion, cellulitis, abscess, OM, septic arthritis         ED Course:         ED Course as of 10/26/21 6503       Wynelle Link Oct 25, 2021        2211  Patient states he was bit by something on his right knee and had a blister to center of knee for past 2 days. Reports blister "popped" and now patient  states his right knee is swelling and painful. Patient also request STD check due to having white discharge from his penis. [OM]        2251  Patient states he does not think he has STI but agrees to prophylactic treatment. Already has an  appointment with Dr. Allena Katz scheduled Wednesday.  [OM]     2259  IMPRESSION   1.  No acute dislocation.   2.  Abnormal tibial plateau contour may be related to age-indeterminate   impaction injury, correlate with history physical exam.    3.  Knee swelling and effusion may be related to soft tissue injury. Please note   MRI may be used for further evaluation of soft tissue/ligamentous injury as   clinically indicated. [OM]              ED Course User Index   [OM] Mindi Curling, DO           Provider Notes (Medical Decision Making):    Patient chooses prophylactic STI treatment. Discussed taking full course of outpatient doxycycline. IM Rocephin given in ER. No signs of penile lesions, discharge on exam. Apparent furuncle and cellulitis  of knee. Doxycycline PO x 1 week. Prompt follow up with Dr. Allena Katz ASAP.    MDM               Procedures     Medical Decision Makingedical Decision Making   Performed by: Mindi Curling, DO   PROCEDURES:   Procedures            Disposition     Disposition: DC-The patient was given verbal follow-up instructions      Discharged      DISCHARGE PLAN:   1.      Current Discharge Medication List                 CONTINUE these medications which have NOT CHANGED          Details        ondansetron (ZOFRAN ODT) 4 mg disintegrating tablet  Take 1 Tablet by mouth every eight (8) hours as needed for Nausea or Vomiting.   Qty: 14 Tablet, Refills: 0  famotidine (Pepcid) 20 mg tablet  Take 1 Tablet by mouth daily for 10 days.   Qty: 10 Tablet, Refills: 0               naproxen (NAPROSYN) 500 mg tablet  TAKE 1 TABLET TWICE A DAY BY ORAL ROUTE FOR 90 DAYS.               methylPREDNISolone (MEDROL DOSEPACK) 4 mg tablet  Per dose pack instructions   Qty: 1 Dose Pack, Refills: 0          Associated Diagnoses: Low back pain, unspecified back pain laterality, unspecified chronicity, unspecified  whether sciatica present                      2.      Follow-up Information                   Follow up With  Specialties  Details  Why  Contact Info              Eulis Canner, MD  Orthopedic Surgery      [address]   [address] [place] [address]   [phone]                     3.  Return to ED if worse    4.      Discharge Medication List as of 10/25/2021 11:27 PM                 START taking these medications          Details        doxycycline (VIBRA-TABS) 100 mg tablet  Take 1 Tablet by mouth two (2) times a day for 7 days., Normal, Disp-14 Tablet, R-0               ibuprofen (MOTRIN) 800 mg tablet  Take 1 Tablet by mouth every six (6) hours as needed for Pain for up to 7 days., Normal, Disp-20 Tablet, R-0                        CONTINUE these medications which have NOT CHANGED          Details        methylPREDNISolone (MEDROL DOSEPACK) 4 mg tablet  Per dose pack instructions, Normal, Disp-1 Dose Pack, R-0               ondansetron (ZOFRAN ODT) 4 mg disintegrating tablet  Take 1 Tablet by mouth every eight (8) hours as needed for Nausea or Vomiting., Normal, Disp-14 Tablet, R-0               famotidine (Pepcid) 20 mg tablet  Take 1 Tablet by mouth daily for 10 days., Normal, Disp-10 Tablet, R-0                              Diagnosis        Clinical Impression:       1.  Routine screening for STI (sexually transmitted infection)      2.  Cellulitis of knee, right         3.  Pain and swelling of knee, right            Attestations:      Mindi Curling, DO  Please note that this dictation was completed with Dragon, the computer voice recognition software.  Quite often unanticipated grammatical, syntax, homophones, and other interpretive errors are inadvertently  transcribed by the computer software.  Please disregard these errors.  Please excuse any errors that have escaped final proofreading.  Thank you.

## 2021-10-25 NOTE — ED Notes (Signed)
Patient medicated with Toradol and Rocephin per Dr. Marlane Mingle orders and tolerated well.

## 2021-10-26 ENCOUNTER — Inpatient Hospital Stay
Admit: 2021-10-26 | Discharge: 2021-10-26 | Disposition: A | Discharge status: Home / Self Care | Payer: BLUE CROSS/BLUE SHIELD | Attending: Emergency Medicine

## 2021-10-26 MED ORDER — KETOROLAC TROMETHAMINE 30 MG/ML INJECTION
30 mg/mL (1 mL) | Freq: Once | INTRAMUSCULAR | Status: AC
Start: 2021-10-26 — End: 2021-10-25
  Administered 2021-10-26: 04:00:00 via INTRAMUSCULAR

## 2021-10-26 MED ORDER — IBUPROFEN 800 MG TAB
800 mg | ORAL_TABLET | Freq: Four times a day (QID) | ORAL | 0 refills | Status: AC | PRN
Start: 2021-10-26 — End: 2021-11-01

## 2021-10-26 MED ORDER — LIDOCAINE (PF) 10 MG/ML (1 %) IJ SOLN
101 mg/mL (1 %) | Freq: Once | INTRAMUSCULAR | Status: DC
Start: 2021-10-26 — End: 2021-10-25

## 2021-10-26 MED ORDER — CEFTRIAXONE 1 GRAM SOLUTION FOR INJECTION
1 gram | INTRAMUSCULAR | Status: AC
Start: 2021-10-26 — End: 2021-10-25
  Administered 2021-10-26: 04:00:00 via INTRAMUSCULAR

## 2021-10-26 MED ORDER — CEFTRIAXONE 1 GRAM SOLUTION FOR INJECTION
1 gram | INTRAMUSCULAR | Status: DC
Start: 2021-10-26 — End: 2021-10-25

## 2021-10-26 MED ORDER — DOXYCYCLINE HYCLATE 100 MG TAB
100 mg | ORAL_TABLET | Freq: Two times a day (BID) | ORAL | 0 refills | Status: AC
Start: 2021-10-26 — End: 2021-11-01

## 2021-10-26 MED FILL — CEFTRIAXONE 1 GRAM SOLUTION FOR INJECTION: 1 gram | INTRAMUSCULAR | Qty: 1

## 2021-10-26 MED FILL — KETOROLAC TROMETHAMINE 30 MG/ML INJECTION: 30 mg/mL (1 mL) | INTRAMUSCULAR | Qty: 1

## 2021-10-27 ENCOUNTER — Encounter: Admit: 2021-10-27 | Discharge: 2021-10-27 | Payer: BLUE CROSS/BLUE SHIELD | Attending: Orthopaedic Surgery

## 2021-10-27 DIAGNOSIS — M545 Low back pain, unspecified: Secondary | ICD-10-CM

## 2021-10-27 MED ORDER — TRIMETHOPRIM-SULFAMETHOXAZOLE 160 MG-800 MG TAB
160-800 mg | ORAL_TABLET | Freq: Two times a day (BID) | ORAL | 0 refills | Status: AC
Start: 2021-10-27 — End: 2021-11-06

## 2021-10-27 NOTE — Progress Notes (Signed)
Name: Jaquavious Mercer    DOB: 12/29/1990     Service Dept: [facility]    Chief Complaint   Patient presents with    Hip Pain    Back Pain        There were no vitals taken for this visit.     No Known Allergies     Current Outpatient Medications   Medication Sig Dispense Refill    trimethoprim-sulfamethoxazole (BACTRIM DS, SEPTRA DS) 160-800 mg per tablet Take 1 Tablet by mouth two (2) times a day for 10 days. 20 Tablet 0    doxycycline (VIBRA-TABS) 100 mg tablet Take 1 Tablet by mouth two (2) times a day for 7 days. 14 Tablet 0    ibuprofen (MOTRIN) 800 mg tablet Take 1 Tablet by mouth every six (6) hours as needed for Pain for up to 7 days. 20 Tablet 0    ondansetron (ZOFRAN ODT) 4 mg disintegrating tablet Take 1 Tablet by mouth every eight (8) hours as needed for Nausea or Vomiting. (Patient not taking: Reported on 10/25/2021) 14 Tablet 0    famotidine (Pepcid) 20 mg tablet Take 1 Tablet by mouth daily for 10 days. (Patient not taking: Reported on 10/25/2021) 10 Tablet 0    methylPREDNISolone (MEDROL DOSEPACK) 4 mg tablet Per dose pack instructions 1 Dose Pack 0      There is no problem list on file for this patient.     Family History   Problem Relation Age of Onset    Cancer Father       Social History     Socioeconomic History    Marital status: SINGLE   Tobacco Use    Smoking status: Former     Types: Cigarettes    Smokeless tobacco: Current   Substance and Sexual Activity    Alcohol use: Yes     Alcohol/week: 7.0 standard drinks     Types: 7 Drinks containing 0.5 oz of alcohol per week      History reviewed. No pertinent surgical history.   History reviewed. No pertinent past medical history.     I have reviewed and agree with PFSH and ROS and intake form in chart and the record furthermore I have reviewed prior medical record(s) regarding this patients care during this appointment.     Review of Systems:   Patient is a pleasant appearing individual,  appropriately dressed, well hydrated, well nourished, who is alert, appropriately oriented for age, and in no acute distress with a normal gait and normal affect who does not appear to be in any significant pain.     Physical Exam:  Left Knee - Full Range of Motion, No crepitation, Grossly neurovascularly intact, Good cap refill, No skin lesion, No effusion, No gross instability, No point tenderness, No quadriceps weakness, No medial or lateral joint line tenderness, Negative Lachman's, Negative McMurray's exam.    The patient's back is neurovascularly intact and appears to have good symmetry on exam with no muscle atrophy noted. Normal curvature of the spine with positive tenderness to palpation. Decreased range of motion in all planes secondary to pain but normal strength. No rashes, echymosis or other skin lesions noted.     The patients bilateral lower extremities are grossly neurovascularly intact with good cap refill, full range of motion and strength. No swelling, echymosis or instability noted. Negative straight leg raise, negative thompson's and negative lachman's. No tenderness to palpation. No foot drop present and patient  has a normal gait.     Encounter Diagnoses     ICD-10-CM ICD-9-CM   1. Low back pain, unspecified back pain laterality, unspecified chronicity, unspecified whether sciatica present  M54.50 724.2   2. Right knee pain, unspecified chronicity  M25.561 719.46       Physical Examination:    He has got a little bit of septic veins, good capillary refill, decreased range of motion, decreased strength, and little bit of point tenderness.  No septic signs.      HPI:  The patient is here with right knee pain, throbbing burning pain, progressively getting worse.  Pain is 7/10.  He thinks he might have gotten bit by a bug, had had difficulty since.      Assessment/Plan:  Plan at this point, my recommendation would be for Xeroform dressing.  We will put him into oral antibiotics for that.  We will  go from there.  If the patient gets worse, he is to give me a call.      As part of continued conservative pain management options the patient was advised to utilize Tylenol or OTC NSAIDS as long as it is not medically contraindicated.     Return to Office:   Follow-up and Dispositions    Return in about 1 week (around 11/03/2021) for [address].           Scribed by Sherian Rein as dictated by [facility] A. Allena Katz, MD.  Documentation True and Accepted Monte Zinni A. Allena Katz, MD

## 2021-10-28 LAB — CHLAMYDIA / GC-AMPLIFIED
CHLAMYDIA TRACHOMATIS, NAA, 188078: NEGATIVE
Chlamydia trachomatis, NAA: NEGATIVE
NEISSERIA GONORRHOEAE, NAA, 188086: NEGATIVE
Neisseria gonorrhoeae, NAA: NEGATIVE

## 2021-11-04 ENCOUNTER — Inpatient Hospital Stay: Admit: 2021-11-04 | Payer: BLUE CROSS/BLUE SHIELD

## 2021-11-04 ENCOUNTER — Encounter: Payer: BLUE CROSS/BLUE SHIELD | Attending: Nurse Practitioner

## 2021-11-04 NOTE — Progress Notes (Signed)
 PT DAILY TREATMENT NOTE 8-14    Patient Name: Nicholas Reed  Date:11/04/2021  DOB: 1991/10/05  [x]   Patient DOB Verified  Payor: BLUE CROSS / [place] / Product Type: PPO /    In time:946  Out time:1031  Total Treatment Time (min): 45  Total Timed Codes (min): 45  1:1 Treatment Time (min):  45  Visit #: 4 of 30    Treatment Area: Low back pain, unspecified [M54.50]    SUBJECTIVE  Pt reported that pain is not too bad unless he has been on feet all day at work or with a lot of movement. Also has c/o groin pain worse than low back.     Pain Level (0-10 scale): 1/10    Any medication changes, allergies to medications, adverse drug reactions, diagnosis change, or new procedure performed?: [x]  No    []  Yes (see summary sheet for update)        OBJECTIVE  Modality rationale: NA today   Min Type Additional Details    []  Estim: [] Att   [] Unatt  [] TENS instruct                 [] IFC  [] Premod [] NMES                       [] Other:  [] w/US    [] w/ice   [] w/heat  Position:  Location:    []   Traction: []  Cervical       [] Lumbar                       []  Prone          [] Supine                       [] Intermittent   [] Continuous Lbs:  []  before manual  []  after manual    []   Ultrasound: [] Continuous   []  Pulsed                           []   [] Location:  W/cm2:    []   Iontophoresis with dexamethasone         Location: []  Take home patch   []  In clinic    []   Ice     []   heat  []   Ice massage Position:  Location:    []   Vasopneumatic Device Pressure: []  lo []  med []  hi   Temp: []  lo []  med []  hi   []  Skin assessment post-treatment:  [] intact [] redness- no adverse reaction       [] redness - adverse reaction:           45 min Therapeutic Exercise:  [x]  See flow sheet :   Rationale: increase ROM, increase strength, and improve balance to improve the patient's ability to return to prior level of function before injury/illness with reduced pain, achieving optimal strength and function to perform  household tasks, daily activities, and return to community events, and/or work.        min Therapeutic Activity:  []   See flow sheet :   Rationale:       min Neuromuscular Re-education:  []   See flow sheet :        min Manual Therapy:          min Gait Training:  ___ feet with ___ device on level surfaces  with ___ level of assist   Rationale:           With TE  TA   NR  GT   Misc Patient Education: [x]  Review HEP    []  Progressed/Changed HEP based on:   []  positioning   []  body mechanics   []  transfers   []  heat/ice application          Pain Level (0-10 scale) post treatment: 1/10    ASSESSMENT/Changes in Function: Began session with warm up riding LBE.  Followed by seated and supine stretching as noted per flow sheet. Pt performed more reps than noted today with some exercises as time count was misunderstood but performed tham with good technique. Plan to resume other exercises next session. Cont POC.     Patient will continue to benefit from skilled PT services to modify and progress therapeutic interventions, address functional mobility deficits, address ROM deficits, and analyze and cue movement patterns to attain remaining goals.     [x]   See Plan of Care  []   See progress note/recertification  []   See Discharge Summary           PLAN  [x]   Upgrade activities as tolerated     [x]   Continue plan of care  []   Update interventions per flow sheet       []   Discharge due to:_  []   Other:_      Consuela CHRISTELLA Brunt, PTA   11/04/2021  10:56 AM

## 2021-11-19 HISTORY — DX: Morbid (severe) obesity due to excess calories: E66.01

## 2021-11-20 ENCOUNTER — Inpatient Hospital Stay: Admit: 2021-11-20 | Payer: BLUE CROSS/BLUE SHIELD

## 2021-11-20 DIAGNOSIS — M545 Low back pain, unspecified: Secondary | ICD-10-CM

## 2021-11-20 NOTE — Progress Notes (Signed)
 PT DAILY TREATMENT NOTE 8-14    Patient Name: Nicholas Reed  Date:11/20/2021  DOB: [birth year]  [x]   Patient DOB Verified  Payor: BLUE CROSS / [place]: [place] OF [place] / Product Type: PPO /    In time:1105 Out time:1144  Total Treatment Time (min): 39  Total Timed Codes (min): 39  1:1 Treatment Time (min):  39  Visit #: 5 of 30    Treatment Area: Low back pain, unspecified [M54.50]    SUBJECTIVE  Pt reported that pain is not too bad unless he has been on feet all day at work or with a lot of movement. Also has c/o groin pain worse than low back in R side. States pain comes and goes.     Pain Level (0-10 scale): 1/10    Any medication changes, allergies to medications, adverse drug reactions, diagnosis change, or new procedure performed?: [x]  No    []  Yes (see summary sheet for update)        OBJECTIVE  Modality rationale: NA today   Min Type Additional Details    []  Estim: [] Att   [] Unatt  [] TENS instruct                 [] IFC  [] Premod [] NMES                       [] Other:  [] w/US    [] w/ice   [] w/heat  Position:  Location:    []   Traction: []  Cervical       [] Lumbar                       []  Prone          [] Supine                       [] Intermittent   [] Continuous Lbs:  []  before manual  []  after manual    []   Ultrasound: [] Continuous   []  Pulsed                           []   [] Location:  W/cm2:    []   Iontophoresis with dexamethasone         Location: []  Take home patch   []  In clinic    []   Ice     []   heat  []   Ice massage Position:  Location:    []   Vasopneumatic Device Pressure: []  lo []  med []  hi   Temp: []  lo []  med []  hi   []  Skin assessment post-treatment:  [] intact [] redness- no adverse reaction       [] redness - adverse reaction:           39 min Therapeutic Exercise:  [x]  See flow sheet :   Rationale: increase ROM, increase strength, and improve balance to improve the patient's ability to return to prior level of function before injury/illness with reduced pain, achieving optimal  strength and function to perform household tasks, daily activities, and return to community events, and/or work.        min Therapeutic Activity:  []   See flow sheet :   Rationale:       min Neuromuscular Re-education:  []   See flow sheet :        min Manual Therapy:          min Gait Training:  ___  feet with ___ device on level surfaces with ___ level of assist   Rationale:           With TE  TA   NR  GT   Misc Patient Education: [x]  Review HEP    []  Progressed/Changed HEP based on:   []  positioning   []  body mechanics   []  transfers   []  heat/ice application          Pain Level (0-10 scale) post treatment: 1/10    ASSESSMENT/Changes in Function: Began session with warm up riding LBE.  Followed by seated and supine stretching as noted per flow sheet. Pt performed more reps than noted today with some exercises as time count was misunderstood but performed with good technique. Plan to resume other exercises next session. Cont POC.     Patient will continue to benefit from skilled PT services to modify and progress therapeutic interventions, address functional mobility deficits, address ROM deficits, and analyze and cue movement patterns to attain remaining goals.     [x]   See Plan of Care  []   See progress note/recertification  []   See Discharge Summary           PLAN  [x]   Upgrade activities as tolerated     [x]   Continue plan of care  []   Update interventions per flow sheet       []   Discharge due to:_  []   Other:_      Consuela CHRISTELLA Brunt, PTA   11/20/2021  10:56 AM

## 2021-11-25 ENCOUNTER — Inpatient Hospital Stay: Admit: 2021-11-25 | Payer: BLUE CROSS/BLUE SHIELD

## 2021-11-25 NOTE — Progress Notes (Signed)
 PT DAILY TREATMENT NOTE 8-14    Patient Name: Nicholas Reed  Date:11/25/2021  DOB: 07-30-1991  [x]   Patient DOB Verified  Payor: BLUE CROSS / Plan: [place] OUT OF [place] / Product Type: PPO /    In time:1100 Out time: 11:47  Total Treatment Time (min): 47  Total Timed Codes (min): 47  1:1 Treatment Time (min): 47  Visit #: 6 of 30    Treatment Area: Low back pain, unspecified [M54.50]    SUBJECTIVE  Pt reported groin pain worse than low back in R side. States pain comes and goes. I really think I need a hip replacement. He is attending a dr appointment with Dr. Pezzela on Jan. 25,2023 but is also planning on a referral to address his hip pain.    Pain Level (0-10 scale): 4/10    Any medication changes, allergies to medications, adverse drug reactions, diagnosis change, or new procedure performed?: [x]  No    []  Yes (see summary sheet for update)        OBJECTIVE  Modality rationale: NA today   Min Type Additional Details    []  Estim: [] Att   [] Unatt  [] TENS instruct                 [] IFC  [] Premod [] NMES                       [] Other:  [] w/US    [] w/ice   [] w/heat  Position:  Location:    []   Traction: []  Cervical       [] Lumbar                       []  Prone          [] Supine                       [] Intermittent   [] Continuous Lbs:  []  before manual  []  after manual    []   Ultrasound: [] Continuous   []  Pulsed                           []   [] Location:  W/cm2:    []   Iontophoresis with dexamethasone         Location: []  Take home patch   []  In clinic    []   Ice     []   heat  []   Ice massage Position:  Location:    []   Vasopneumatic Device Pressure: []  lo []  med []  hi   Temp: []  lo []  med []  hi   []  Skin assessment post-treatment:  [] intact [] redness- no adverse reaction       [] redness - adverse reaction:           47 min Therapeutic Exercise:  [x]  See flow sheet :   Rationale: increase ROM, increase strength, and improve balance to improve the patient's ability to return to prior level of  function before injury/illness with reduced pain, achieving optimal strength and function to perform household tasks, daily activities, and return to community events, and/or work.        min Therapeutic Activity:  []   See flow sheet :   Rationale:       min Neuromuscular Re-education:  []   See flow sheet :        min Manual Therapy:  min Gait Training:  ___ feet with ___ device on level surfaces with ___ level of assist   Rationale:           With TE  TA   NR  GT   Misc Patient Education: [x]  Review HEP    []  Progressed/Changed HEP based on:   []  positioning   []  body mechanics   []  transfers   []  heat/ice application          Pain Level (0-10 scale) post treatment: 3/10    ASSESSMENT/Changes in Function: Began session with warm up riding LBE.  Followed by seated seated stretching of the hamstring and piriforms B LE's.  Supine stretching via LTR, SKTC, open books and bridging with PPT as noted per flow sheet.  During session constant compliant of R hip pain. Cont POC.     Patient will continue to benefit from skilled PT services to modify and progress therapeutic interventions, address functional mobility deficits, address ROM deficits, and analyze and cue movement patterns to attain remaining goals.     [x]   See Plan of Care  []   See progress note/recertification  []   See Discharge Summary           PLAN  [x]   Upgrade activities as tolerated     [x]   Continue plan of care  []   Update interventions per flow sheet       []   Discharge due to:_  []   Other:_      Almarie Candance Laine   11/25/2021  12:00 PM

## 2021-12-09 ENCOUNTER — Ambulatory Visit: Payer: BLUE CROSS/BLUE SHIELD | Attending: Physical Medicine & Rehabilitation

## 2022-01-20 ENCOUNTER — Encounter: Payer: BLUE CROSS/BLUE SHIELD | Attending: Physical Medicine & Rehabilitation

## 2022-01-20 ENCOUNTER — Ambulatory Visit: Payer: BLUE CROSS/BLUE SHIELD | Attending: Physical Medicine & Rehabilitation

## 2022-01-20 MED ORDER — TOPIRAMATE 25 MG PO TABS
25 MG | ORAL_TABLET | Freq: Every evening | ORAL | 1 refills | Status: AC
Start: 2022-01-20 — End: ?
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
## 2022-01-29 ENCOUNTER — Ambulatory Visit: Payer: BLUE CROSS/BLUE SHIELD | Primary: Diagnostic Radiology

## 2022-01-29 DIAGNOSIS — M545 Low back pain, unspecified: Secondary | ICD-10-CM

## 2022-01-29 DIAGNOSIS — M4726 Other spondylosis with radiculopathy, lumbar region: Secondary | ICD-10-CM

## 2022-02-03 NOTE — Telephone Encounter (Signed)
Spoke to pharmacy and pt did not pick up med in time so it was put back in inventory. She advised it will be ready b the end of the day.    Spoke to patient using two identifiers informed pt that the prescription could be picked up once the prescription is ready

## 2022-02-03 NOTE — Telephone Encounter (Signed)
Patient wasn't able to pick up prescription for topiramate (TOPAMAX) 25 MG tablet in a timely manner and it was returned.  Please call in again to [facility] in [place] for patient to fill.

## 2022-02-24 ENCOUNTER — Ambulatory Visit
Admit: 2022-02-24 | Discharge: 2022-02-24 | Payer: PRIVATE HEALTH INSURANCE | Attending: Physical Medicine & Rehabilitation | Primary: Medical

## 2022-02-24 DIAGNOSIS — M5416 Radiculopathy, lumbar region: Secondary | ICD-10-CM
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
## 2022-03-04 DIAGNOSIS — T148XXA Other injury of unspecified body region, initial encounter: Secondary | ICD-10-CM | POA: Insufficient documentation

## 2022-03-04 DIAGNOSIS — M4180 Other forms of scoliosis, site unspecified: Secondary | ICD-10-CM | POA: Insufficient documentation

## 2022-03-04 DIAGNOSIS — M5431 Sciatica, right side: Secondary | ICD-10-CM | POA: Insufficient documentation

## 2022-03-04 DIAGNOSIS — R351 Nocturia: Secondary | ICD-10-CM

## 2022-03-04 DIAGNOSIS — R634 Abnormal weight loss: Secondary | ICD-10-CM

## 2022-03-04 DIAGNOSIS — M47816 Spondylosis without myelopathy or radiculopathy, lumbar region: Secondary | ICD-10-CM | POA: Insufficient documentation

## 2022-03-04 HISTORY — DX: Nocturia: R35.1

## 2022-03-04 HISTORY — DX: Other injury of unspecified body region, initial encounter: T14.8XXA

## 2022-03-04 HISTORY — DX: Abnormal weight loss: R63.4

## 2022-03-26 NOTE — Telephone Encounter (Signed)
Patient was told to call us back with insurance information - He provided the same information that is in the old scheduling system, [place] [id number].  Unable to add this to current chart as error message says not eligible.  Patient requests we contact him on Monday at [phone].

## 2022-03-30 NOTE — Telephone Encounter (Signed)
LMOVM for patient.

## 2022-04-16 DIAGNOSIS — I8390 Asymptomatic varicose veins of unspecified lower extremity: Secondary | ICD-10-CM | POA: Insufficient documentation

## 2022-04-16 DIAGNOSIS — M6281 Muscle weakness (generalized): Secondary | ICD-10-CM

## 2022-04-16 DIAGNOSIS — Z6841 Body Mass Index (BMI) 40.0 and over, adult: Secondary | ICD-10-CM | POA: Insufficient documentation

## 2022-04-16 HISTORY — DX: Muscle weakness (generalized): M62.81

## 2022-04-16 HISTORY — DX: Body Mass Index (BMI) 40.0 and over, adult: Z684

## 2022-05-19 DIAGNOSIS — R252 Cramp and spasm: Secondary | ICD-10-CM

## 2022-05-19 DIAGNOSIS — F410 Panic disorder [episodic paroxysmal anxiety] without agoraphobia: Secondary | ICD-10-CM | POA: Insufficient documentation

## 2022-05-19 DIAGNOSIS — M25561 Pain in right knee: Secondary | ICD-10-CM | POA: Insufficient documentation

## 2022-05-19 HISTORY — DX: Cramp and spasm: R25.2

## 2022-05-27 DIAGNOSIS — T63301A Toxic effect of unspecified spider venom, accidental (unintentional), initial encounter: Secondary | ICD-10-CM | POA: Insufficient documentation

## 2022-05-27 DIAGNOSIS — L0390 Cellulitis, unspecified: Secondary | ICD-10-CM

## 2022-05-27 HISTORY — DX: Toxic effect of unspecified spider venom, accidental (unintentional), initial encounter: T63.301A

## 2022-05-27 HISTORY — DX: Cellulitis, unspecified: L03.90

## 2022-05-30 ENCOUNTER — Inpatient Hospital Stay
Admit: 2022-05-30 | Discharge: 2022-05-30 | Disposition: A | Discharge status: Home / Self Care | Payer: BLUE CROSS/BLUE SHIELD | Attending: Family Medicine

## 2022-05-30 DIAGNOSIS — H6001 Abscess of right external ear: Secondary | ICD-10-CM

## 2022-05-30 MED ORDER — SULFAMETHOXAZOLE-TRIMETHOPRIM 800-160 MG PO TABS
800-160 MG | ORAL_TABLET | Freq: Two times a day (BID) | ORAL | 0 refills | Status: AC
Start: 2022-05-30 — End: 2022-06-06

## 2022-05-30 NOTE — ED Provider Notes (Signed)
[facility] EMERGENCY DEPT  EMERGENCY DEPARTMENT ENCOUNTER      Pt Name: [facility]  MRN: [id number]  Birthdate May 07, 1991  Date of evaluation: 05/30/2022  Provider: Juventino Slovak, DO    CHIEF COMPLAINT       Chief Complaint   Patient presents with    Abscess     Right side neck         HISTORY OF PRESENT ILLNESS   (Location/Symptom, Timing/Onset, Context/Setting, Quality, Duration, Modifying Factors, Severity)  Note limiting factors.   Nicholas Reed is a 31 y.o. male who presents to the emergency department patient comes in with 2 abscesses 1 on his right lateral neck as well as one in the tragus of the right ear.  He states its been going on for about 1 week.  Progressively getting worse both of them.  He does state he squeezed the one in his neck.  And this started to ooze out..  Denies any fevers at this time.    HPI    Nursing Notes were reviewed.    REVIEW OF SYSTEMS    (2-9 systems for level 4, 10 or more for level 5)     Review of Systems   Skin:  Positive for wound.   All other systems reviewed and are negative.    Except as noted above the remainder of the review of systems was reviewed and negative.       PAST MEDICAL HISTORY   History reviewed. No pertinent past medical history.      SURGICAL HISTORY     History reviewed. No pertinent surgical history.      CURRENT MEDICATIONS       Previous Medications    IBUPROFEN (ADVIL;MOTRIN) 800 MG TABLET    TAKE 1 TABLET BY MOUTH THREE TIMES DAILY AS NEEDED    TOPIRAMATE (TOPAMAX) 25 MG TABLET    Take 3 tablets by mouth nightly       ALLERGIES     Patient has no known allergies.    FAMILY HISTORY       Family History   Problem Relation Age of Onset    Cancer Father           SOCIAL HISTORY       Social History     Socioeconomic History    Marital status: Single     Spouse name: None    Number of children: None    Years of education: None    Highest education level: None   Tobacco Use    Smoking status: Former    Smokeless tobacco: Current   Substance and  Sexual Activity    Alcohol use: Yes     Alcohol/week: 7.0 standard drinks     Comment: occassionally    Drug use: Yes     Types: Marijuana Sheran Fava)     Social Determinants of Health     Physical Activity: Unknown    Days of Exercise per Week: 3 days   Intimate Partner Violence: Not At Risk    Fear of Current or Ex-Partner: No    Emotionally Abused: No    Physically Abused: No    Sexually Abused: No       SCREENINGS                               CIWA Assessment  BP: (!) 146/85  Pulse: 78  PHYSICAL EXAM    (up to 7 for level 4, 8 or more for level 5)     ED Triage Vitals [05/30/22 1242]   BP Temp Temp src Pulse Respirations SpO2 Height Weight - Scale   (!) 146/85 98.6 F (37 C) -- 78 16 99 % 6\' 1"  (1.854 m) (!) 314 lb (142.4 kg)       Physical Exam  Vitals and nursing note reviewed.   Constitutional:       General: He is not in acute distress.     Appearance: Normal appearance. He is obese. He is ill-appearing. He is not toxic-appearing or diaphoretic.   HENT:      Head: Normocephalic and atraumatic.      Ears:      Comments: Right tragus is swollen when compared to the left tragus.  Tender to the touch some fluctuance appreciated.     Nose: Nose normal.      Mouth/Throat:      Mouth: Mucous membranes are moist.   Cardiovascular:      Rate and Rhythm: Normal rate and regular rhythm.   Pulmonary:      Effort: Pulmonary effort is normal.   Skin:     General: Skin is warm and dry.      Comments: An abscess noted on the right lateral neck appears to be oozing on its own.  Does have some purulent discharge.   Neurological:      General: No focal deficit present.      Mental Status: He is alert and oriented to person, place, and time.   Psychiatric:         Mood and Affect: Mood normal.       DIAGNOSTIC RESULTS     EKG: All EKG's are interpreted by the Emergency Department Physician who either signs or Co-signs this chart in the absence of a cardiologist.        RADIOLOGY:   Non-plain film images such as CT,  Ultrasound and MRI are read by the radiologist. Plain radiographic images are visualized and preliminarily interpreted by the emergency physician with the below findings:        Interpretation per the Radiologist below, if available at the time of this note:    No orders to display         ED BEDSIDE ULTRASOUND:   Performed by ED Physician - none    LABS:  Labs Reviewed - No data to display    All other labs were within normal range or not returned as of this dictation.    EMERGENCY DEPARTMENT COURSE and DIFFERENTIAL DIAGNOSIS/MDM:   Vitals:    Vitals:    05/30/22 1242   BP: (!) 146/85   Pulse: 78   Resp: 16   Temp: 98.6 F (37 C)   SpO2: 99%   Weight: (!) 314 lb (142.4 kg)   Height: 6\' 1"  (1.854 m)         Medical Decision Making  In my differential is abscess.  I do use an ultrasound to rule out a lymph node in the area of the right tragus.  And indeed fluctuance was noted.  We did go ahead and opened this up moderate amount of drainage was noted of purulent discharge.  I did write him some Bactrim given the location of this abscess on and near his face.  The one on his neck I also opened up a little bit more to facilitate drainage and did also packed  that one as well.    He needs to return in 2 days and take 2 regular strength ibuprofen 30 minutes prior to arrival.  Been given return precautions.    Risk  Prescription drug management.            REASSESSMENT          CRITICAL CARE TIME       CONSULTS:  None    PROCEDURES:  Unless otherwise noted below, none     Incision/Drainage    Date/Time: 05/30/2022 1:17 PM  Performed by: Juventino Slovak, DO  Authorized by: Juventino Slovak, DO     Consent:     Consent obtained:  Verbal    Consent given by:  Patient    Risks discussed:  Bleeding, incomplete drainage, pain and infection  Universal protocol:     Procedure explained and questions answered to patient or proxy's satisfaction: yes      Patient identity confirmed:  Verbally with patient  Location:     Type:  Abscess  (Right tragus)    Size:  1.5    Location: Right tragus.  Pre-procedure details:     Skin preparation:  Povidone-iodine  Sedation:     Sedation type:  None  Anesthesia:     Anesthesia method:  Local infiltration    Local anesthetic:  Lidocaine 1% w/o epi  Procedure type:     Complexity:  Complex  Procedure details:     Ultrasound guidance: yes      Incision types:  Stab incision    Incision depth:  Dermal    Wound management:  Probed and deloculated    Drainage:  Purulent and bloody    Drainage amount:  Moderate    Packing materials:  1/4 in iodoform gauze    Amount 1/4" iodoform:  2 in  Post-procedure details:     Procedure completion:  Tolerated  Incision/Drainage    Date/Time: 05/30/2022 1:19 PM  Performed by: Juventino Slovak, DO  Authorized by: Juventino Slovak, DO     Consent:     Consent obtained:  Verbal    Consent given by:  Patient    Risks discussed:  Bleeding, infection and pain  Location:     Type:  Abscess    Size:  1    Location:  Neck    Neck location:  R posterior  Pre-procedure details:     Skin preparation:  Povidone-iodine  Sedation:     Sedation type:  None  Anesthesia:     Anesthesia method:  Local infiltration    Local anesthetic:  Lidocaine 1% w/o epi  Procedure type:     Complexity:  Complex  Procedure details:     Ultrasound guidance: no      Incision types:  Stab incision    Packing materials:  1/4 in iodoform gauze    Amount 1/4" iodoform:  0.5 inches  Post-procedure details:     Procedure completion:  Tolerated  Comments:      Bandage placed over the top        FINAL IMPRESSION      1. Abscess of tragus of right ear    2. Abscess of skin of neck          DISPOSITION/PLAN   DISPOSITION Decision To Discharge 05/30/2022 01:12:26 PM      PATIENT REFERRED TO:  No follow-up provider specified.    DISCHARGE MEDICATIONS:  New Prescriptions    SULFAMETHOXAZOLE-TRIMETHOPRIM (BACTRIM DS)  800-160 MG PER TABLET    Take 1 tablet by mouth 2 times daily for 7 days     Controlled Substances Monitoring:     No  flowsheet data found.    (Please note that portions of this note were completed with a voice recognition program.  Efforts were made to edit the dictations but occasionally words are mis-transcribed.)    Juventino Slovak, DO (electronically signed)  Attending Emergency Physician           Juventino Slovak, DO  05/30/22 1320

## 2022-05-30 NOTE — ED Notes (Signed)
Abscess to the right side of the neck and his ear, were prepped and lanced by the EDP. Iodiform wicks were placed in the wounds anb covered     Marjean Donna, RN  05/30/22 1328

## 2022-05-30 NOTE — Discharge Instructions (Signed)
As we spoke recommendations are to return here in 2 days to have the packing repacked.  I do recommend 2 regular strength ibuprofen tablets 30 minutes prior to coming to the emergency department.  I have called in an antibiotic for you 1 tablet twice a day for the next 7 days.  Watch for signs of increasing infection, you start running a fever.  Return to the emergency department.  Should slowly start to improve in the next 24 hours.  Return to the emergency department if there is any other questions or concerns.

## 2022-05-31 DIAGNOSIS — H6001 Abscess of right external ear: Secondary | ICD-10-CM | POA: Insufficient documentation

## 2022-06-07 ENCOUNTER — Inpatient Hospital Stay: Payer: BLUE CROSS/BLUE SHIELD | Primary: Medical

## 2023-01-11 ENCOUNTER — Encounter (HOSPITAL_COMMUNITY): Payer: Self-pay

## 2023-01-11 ENCOUNTER — Ambulatory Visit (HOSPITAL_COMMUNITY)
Admission: EM | Admit: 2023-01-11 | Discharge: 2023-01-11 | Disposition: A | Discharge status: Home / Self Care | Payer: BLUE CROSS/BLUE SHIELD | Attending: Emergency Medicine | Admitting: Emergency Medicine

## 2023-01-11 DIAGNOSIS — Z202 Contact with and (suspected) exposure to infections with a predominantly sexual mode of transmission: Secondary | ICD-10-CM | POA: Diagnosis present

## 2023-01-11 DIAGNOSIS — Z113 Encounter for screening for infections with a predominantly sexual mode of transmission: Secondary | ICD-10-CM | POA: Insufficient documentation

## 2023-01-11 NOTE — Discharge Instructions (Signed)
We will call you if anything on your swab returns positive. Please abstain from sexual intercourse until your results return.

## 2023-01-11 NOTE — ED Provider Notes (Signed)
[facility]    CSN: [id number] Arrival date & time: 01/11/23  1914      History   Chief Complaint Chief Complaint  Patient presents with   Exposure to STD    HPI Jeffrey Mack is a 32 y.o. male.  Reports possible exposure to chlamydia and/or gonorrhea Unsure which but partner had a positive test He does not have any symptoms  No penile discharge, dysuria, rash, lesion, testicular pain or swelling  History reviewed. No pertinent past medical history.  Patient Active Problem List   Diagnosis Date Noted   Cardiomegaly 04/22/2015   Chest pain at rest 04/22/2015    History reviewed. No pertinent surgical history.     Home Medications    Prior to Admission medications   Not on File    Family History Family History  Problem Relation Age of Onset   Kidney disease Father    Lupus Father    Diabetes Maternal Grandfather    Heart attack Paternal Grandfather     Social History Social History   Tobacco Use   Smoking status: Every Day  Substance Use Topics   Alcohol use: Yes   Drug use: Yes    Types: Marijuana     Allergies   Patient has no known allergies.   Review of Systems Review of Systems As per HPI  Physical Exam Triage Vital Signs ED Triage Vitals  Enc Vitals Group     BP 01/11/23 1959 121/73     Pulse Rate 01/11/23 1959 78     Resp 01/11/23 1959 18     Temp 01/11/23 1959 98.5 F (36.9 C)     Temp Source 01/11/23 1959 Oral     SpO2 01/11/23 1959 98 %     Weight --      Height --      Head Circumference --      Peak Flow --      Pain Score 01/11/23 2000 0     Pain Loc --      Pain Edu? --      Excl. in [place]? --    No data found.  Updated Vital Signs BP 121/73 (BP Location: Left Arm)   Pulse 78   Temp 98.5 F (36.9 C) (Oral)   Resp 18   SpO2 98%   Visual Acuity Right Eye Distance:   Left Eye Distance:   Bilateral Distance:    Right Eye Near:   Left Eye Near:    Bilateral Near:     Physical Exam Vitals  and nursing note reviewed.  Constitutional:      General: He is not in acute distress.    Appearance: Normal appearance.  HENT:     Mouth/Throat:     Pharynx: Oropharynx is clear.  Cardiovascular:     Rate and Rhythm: Normal rate and regular rhythm.     Pulses: Normal pulses.  Pulmonary:     Effort: Pulmonary effort is normal.  Neurological:     Mental Status: He is alert and oriented to person, place, and time.      UC Treatments / Results  Labs (all labs ordered are listed, but only abnormal results are displayed) Labs Reviewed  CYTOLOGY, (ORAL, ANAL, URETHRAL) ANCILLARY ONLY    EKG   Radiology No results found.  Procedures Procedures (including critical care time)  Medications Ordered in UC Medications - No data to display  Initial Impression / Assessment and Plan / UC Course  I have  reviewed the triage vital signs and the nursing notes.  Pertinent labs & imaging results that were available during my care of the patient were reviewed by me and considered in my medical decision making (see chart for details).  Cytology swab is pending Will treat positive result as indicated. Discussed waiting for test before treatment, discussed abx resistance No questions at this time  Final Clinical Impressions(s) / UC Diagnoses   Final diagnoses:  Possible exposure to STD  Screen for STD (sexually transmitted disease)     Discharge Instructions      We will call you if anything on your swab returns positive. Please abstain from sexual intercourse until your results return.     ED Prescriptions   None    PDMP not reviewed this encounter.   Sherise Geerdes, [place] Gu[place], [place] 01/11/23 2015

## 2023-01-11 NOTE — ED Triage Notes (Signed)
Pt states had a positive exposure to chlamydia and gonorrhea. Denies d/c but maybe a little itchy.

## 2023-01-12 LAB — CYTOLOGY, (ORAL, ANAL, URETHRAL) ANCILLARY ONLY
Chlamydia: NEGATIVE
Comment: NEGATIVE
Comment: NEGATIVE
Comment: NORMAL
Neisseria Gonorrhea: NEGATIVE
Trichomonas: NEGATIVE

## 2023-01-19 ENCOUNTER — Encounter (HOSPITAL_COMMUNITY): Payer: Self-pay

## 2023-01-19 ENCOUNTER — Ambulatory Visit (HOSPITAL_COMMUNITY)
Admission: EM | Admit: 2023-01-19 | Discharge: 2023-01-19 | Disposition: A | Discharge status: Home / Self Care | Payer: BLUE CROSS/BLUE SHIELD | Attending: Emergency Medicine | Admitting: Emergency Medicine

## 2023-01-19 ENCOUNTER — Ambulatory Visit (INDEPENDENT_AMBULATORY_CARE_PROVIDER_SITE_OTHER): Payer: BLUE CROSS/BLUE SHIELD

## 2023-01-19 DIAGNOSIS — S91115A Laceration without foreign body of left lesser toe(s) without damage to nail, initial encounter: Secondary | ICD-10-CM

## 2023-01-19 MED ORDER — KETOROLAC TROMETHAMINE 60 MG/2ML IM SOLN
INTRAMUSCULAR | Status: AC
  Filled 2023-01-19: qty 2

## 2023-01-19 MED ORDER — KETOROLAC TROMETHAMINE 60 MG/2ML IM SOLN
60.0000 mg | Freq: Once | INTRAMUSCULAR | Status: AC
  Administered 2023-01-19: 60 mg via INTRAMUSCULAR

## 2023-01-19 MED ORDER — MUPIROCIN CALCIUM 2 % EX CREA: 1.0000 | TOPICAL_CREAM | Freq: Two times a day (BID) | CUTANEOUS | 0 refills | Status: DC

## 2023-01-19 NOTE — ED Triage Notes (Signed)
Pt is here for laceration to pinky toe on the left foot x 1day

## 2023-01-19 NOTE — ED Provider Notes (Signed)
[facility]    CSN: [id number] Arrival date & time: 01/19/23  1911      History   Chief Complaint Chief Complaint  Patient presents with   Laceration    HPI Jeffrey Mack is a 32 y.o. male.   Patient reports laceration to left pinky toe that occurred yesterday.  He was moving around some things in his living room and some glass shattered, cutting his pinky toe.  He went to work today and was fine, when he got home he stubbed it and it started bleeding again.  Is currently soaking it with an antibacterial in clinic, had not cleaned it up until this point.   Reports tetanus has been updated within the last year.     The history is provided by the patient.  Laceration Associated symptoms: no fever and no rash     History reviewed. No pertinent past medical history.  Patient Active Problem List   Diagnosis Date Noted   Cardiomegaly 04/22/2015   Chest pain at rest 04/22/2015    History reviewed. No pertinent surgical history.     Home Medications    Prior to Admission medications   Medication Sig Start Date End Date Taking? Authorizing Provider  mupirocin cream (BACTROBAN) 2 % Apply 1 Application topically 2 (two) times daily. 01/19/23  Yes Jaice Lague, [place], FNP    Family History Family History  Problem Relation Age of Onset   Kidney disease Father    Lupus Father    Diabetes Maternal Grandfather    Heart attack Paternal Grandfather     Social History Social History   Tobacco Use   Smoking status: Every Day  Substance Use Topics   Alcohol use: Yes   Drug use: Yes    Types: Marijuana     Allergies   Patient has no known allergies.   Review of Systems Review of Systems  Constitutional:  Negative for fever.  Respiratory:  Negative for cough.   Cardiovascular:  Negative for chest pain.  Musculoskeletal:  Negative for gait problem and joint swelling.  Skin:  Positive for wound. Negative for color change and rash.     Physical  Exam Triage Vital Signs ED Triage Vitals  Enc Vitals Group     BP 01/19/23 1951 (!) 140/81     Pulse Rate 01/19/23 1951 75     Resp 01/19/23 1951 12     Temp 01/19/23 1951 98.5 F (36.9 C)     Temp Source 01/19/23 1951 Oral     SpO2 01/19/23 1951 96 %     Weight --      Height --      Head Circumference --      Peak Flow --      Pain Score 01/19/23 1949 10     Pain Loc --      Pain Edu? --      Excl. in [place]? --    No data found.  Updated Vital Signs BP (!) 140/81 (BP Location: Left Arm)   Pulse 75   Temp 98.5 F (36.9 C) (Oral)   Resp 12   SpO2 96%   Visual Acuity Right Eye Distance:   Left Eye Distance:   Bilateral Distance:    Right Eye Near:   Left Eye Near:    Bilateral Near:     Physical Exam Vitals and nursing note reviewed.  Constitutional:      General: He is not in acute distress.  Appearance: Normal appearance. He is not ill-appearing.  HENT:     Head: Normocephalic and atraumatic.     Right Ear: External ear normal.     Left Ear: External ear normal.     Mouth/Throat:     Mouth: Mucous membranes are moist.  Cardiovascular:     Rate and Rhythm: Normal rate and regular rhythm.     Pulses:          Dorsalis pedis pulses are 2+ on the left side.       Posterior tibial pulses are 2+ on the left side.  Pulmonary:     Effort: Pulmonary effort is normal. No respiratory distress.  Musculoskeletal:        General: Tenderness and signs of injury present. No swelling or deformity. Normal range of motion.     Right lower leg: No edema.     Left lower leg: No edema.  Feet:     Left foot:     Skin integrity: No ulcer or erythema.     Toenail Condition: Left toenails are normal.     Comments: Laceration noted to proximal phalanx of small left toe, unable to approximate edges, approx. 2 cm in length. Sensation intact, brisk capillary refill.  Skin:    General: Skin is warm and dry.     Capillary Refill: Capillary refill takes less than 2 seconds.   Neurological:     General: No focal deficit present.     Mental Status: He is alert and oriented to person, place, and time.  Psychiatric:        Behavior: Behavior is cooperative.      UC Treatments / Results  Labs (all labs ordered are listed, but only abnormal results are displayed) Labs Reviewed - No data to display  EKG   Radiology DG Toe 5th Left  Result Date: 01/19/2023 CLINICAL DATA:  Laceration, pain. Query glass. EXAM: DG TOE 5TH LEFT COMPARISON:  None Available. FINDINGS: There is no evidence of fracture or dislocation. There is no evidence of arthropathy or other focal bone abnormality. Skin defect laterally typical of laceration. No radiopaque foreign body IMPRESSION: Skin defect laterally typical of laceration. No radiopaque foreign body or osseous abnormality. Electronically Signed   By: Keith Rake M.D.   On: 01/19/2023 20:31    Procedures Procedures (including critical care time)  Medications Ordered in UC Medications  ketorolac (TORADOL) injection 60 mg (60 mg Intramuscular Given 01/19/23 2034)    Initial Impression / Assessment and Plan / UC Course  I have reviewed the triage vital signs and the nursing notes.  Pertinent labs & imaging results that were available during my care of the patient were reviewed by me and considered in my medical decision making (see chart for details).  Vitals and triage reviewed, patient is hemodynamically stable. Neurovascularly intact.  Left small toe imaging without FB, no fractures or dislocations. Due to duration and superficial nature of laceration, unable to approximate edges, will apply topical antibacterial ointment and non-stick dressing. Discussed antibacterial soaks. Plan of care discussed, follow-up care and return precautions given, patient verbalized understanding, no questions at this time.      Final Clinical Impressions(s) / UC Diagnoses   Final diagnoses:  Laceration of lesser toe of left foot without  foreign body present or damage to nail, initial encounter     Discharge Instructions      Your imaging was negative for retained foreign body.  Please do warm soaks with over-the-counter Hibiclens or  other antibacterial solutions.  You can apply antibacterial ointment Bactroban twice daily and then wrap your wound.  Please keep this clean and dry.  Please return to clinic or follow-up with a primary care provider if you develop continued pain, redness, streaking or fever, as these could be signs of infection.      ED Prescriptions     Medication Sig Dispense Auth. Provider   mupirocin cream (BACTROBAN) 2 % Apply 1 Application topically 2 (two) times daily. 15 g Ipek Westra, [place], [place]      I have reviewed the PDMP during this encounter.   Louretta Shorten [place], [place] 01/21/23 [phone]

## 2023-01-19 NOTE — Discharge Instructions (Addendum)
Your imaging was negative for retained foreign body.  Please do warm soaks with over-the-counter Hibiclens or other antibacterial solutions.  You can apply antibacterial ointment Bactroban twice daily and then wrap your wound.  Please keep this clean and dry.  Please return to clinic or follow-up with a primary care provider if you develop continued pain, redness, streaking or fever, as these could be signs of infection.

## 2023-01-21 ENCOUNTER — Telehealth: Payer: Self-pay | Admitting: Emergency Medicine

## 2023-01-21 MED ORDER — MUPIROCIN 2 % EX OINT: TOPICAL_OINTMENT | Freq: Two times a day (BID) | CUTANEOUS | Status: AC

## 2023-01-21 NOTE — Telephone Encounter (Signed)
Patient needed ointment instead of cream for insurance, okay'd by Dr. Lanny Cramp

## 2024-02-06 ENCOUNTER — Other Ambulatory Visit: Payer: Self-pay

## 2024-02-06 ENCOUNTER — Ambulatory Visit
Admission: EM | Admit: 2024-02-06 | Discharge: 2024-02-06 | Disposition: A | Discharge status: Home / Self Care | Attending: Family Medicine | Admitting: Family Medicine

## 2024-02-06 ENCOUNTER — Encounter: Payer: Self-pay | Admitting: *Deleted

## 2024-02-06 DIAGNOSIS — Z202 Contact with and (suspected) exposure to infections with a predominantly sexual mode of transmission: Secondary | ICD-10-CM | POA: Diagnosis not present

## 2024-02-06 DIAGNOSIS — L84 Corns and callosities: Secondary | ICD-10-CM | POA: Diagnosis not present

## 2024-02-06 DIAGNOSIS — M79672 Pain in left foot: Secondary | ICD-10-CM | POA: Insufficient documentation

## 2024-02-06 NOTE — Discharge Instructions (Addendum)
 I placed a referral for you to be evaluated at [facility].  They will call you to schedule an appointment or you can contact here office tomorrow.  Your lab results will be available in 24 hours.  Our clinical results nurse will reach out to you if any of your results are abnormal however if you have any questions related to your results feel free to contact our office directly.

## 2024-02-06 NOTE — ED Provider Notes (Signed)
 [name]    [name] Arrival date & time: 02/06/24  1519      History   Chief Complaint Chief Complaint  Patient presents with   Foot Pain   Exposure to STD    HPI Jeffrey Mack is a 33 y.o. male.  Patient here today for evaluation of left foot pain distal from the 3rd toe. Patient endorses pain with walking and he stands for extended periods on time for place of  employment which exacerbated foot pain.   Endorses that the callus has been present several years however has been becoming increasingly painful recently.   Patient is also requesting routine STD testing.  Patient reports that he was advised that he was exposed to bacterial vaginosis and chlamydia.  He is asymptomatic. Declines HIV and RPR testing.   No past medical history on file.  Patient Active Problem List   Diagnosis Date Noted   Cardiomegaly 04/22/2015   Chest pain at rest 04/22/2015    No past surgical history on file.     Home Medications    Prior to Admission medications   Medication Sig Start Date End Date Taking? Authorizing Provider  ibuprofen (ADVIL) 800 MG tablet Take 1 tablet by mouth 3 (three) times daily as needed. 12/19/21  Yes [provider]  methylPREDNISolone (MEDROL DOSEPAK) 4 MG TBPK tablet Take 4 mg by mouth as directed. 09/29/21  Yes [provider]  naproxen (NAPROSYN) 500 MG tablet Take 500 mg by mouth 2 (two) times daily with a meal. 09/18/12  Yes [provider]  ondansetron (ZOFRAN-ODT) 4 MG disintegrating tablet Take 4 mg by mouth every 8 (eight) hours as needed. 10/20/21  Yes [provider]  topiramate (TOPAMAX) 25 MG tablet Take 25 mg by mouth at bedtime. 01/20/22  Yes [provider]    Family History Family History  Problem Relation Age of Onset   Kidney disease Father    Lupus Father    Diabetes Maternal Grandfather    Heart attack Paternal Grandfather     Social History Social History    Tobacco Use   Smoking status: Former    Types: Cigarettes  Vaping Use   Vaping status: Never Used  Substance Use Topics   Alcohol use: Not Currently   Drug use: Not Currently    Types: Marijuana     Allergies   Patient has no known allergies.   Review of Systems Review of Systems Pertinent negatives listed in HPI   Physical Exam Triage Vital Signs ED Triage Vitals  Encounter Vitals Group     BP 02/06/24 1644 (!) 140/78     Systolic BP Percentile --      Diastolic BP Percentile --      Pulse Rate 02/06/24 1644 72     Resp 02/06/24 1644 16     Temp 02/06/24 1644 98.1 F (36.7 C)     Temp Source 02/06/24 1644 Oral     SpO2 02/06/24 1644 98 %     Weight --      Height --      Head Circumference --      Peak Flow --      Pain Score 02/06/24 1641 5     Pain Loc --      Pain Education --      Exclude from Growth Chart --    No data found.  Updated Vital Signs BP (!) 140/78 (BP Location: Left Arm)   Pulse 72  Temp 98.1 F (36.7 C) (Oral)   Resp 16   SpO2 98%   Visual Acuity Right Eye Distance:   Left Eye Distance:   Bilateral Distance:    Right Eye Near:   Left Eye Near:    Bilateral Near:     Physical Exam Vitals and nursing note reviewed.  Constitutional:      Appearance: Normal appearance.  HENT:     Head: Normocephalic and atraumatic.  Eyes:     Extraocular Movements: Extraocular movements intact.     Pupils: Pupils are equal, round, and reactive to light.  Cardiovascular:     Rate and Rhythm: Normal rate and regular rhythm.  Pulmonary:     Effort: Pulmonary effort is normal.     Breath sounds: Normal breath sounds.  Musculoskeletal:     Cervical back: Normal range of motion and neck supple.       Feet:  Neurological:     General: No focal deficit present.     Mental Status: He is alert and oriented to person, place, and time.       Cytology self collected UC Treatments / Results  Labs (all labs ordered are listed, but only  abnormal results are displayed) Labs Reviewed  CYTOLOGY, (ORAL, ANAL, URETHRAL) ANCILLARY ONLY    EKG   Radiology No results found.  Procedures Procedures (including critical care time)  Medications Ordered in UC Medications - No data to display  Initial Impression / Assessment and Plan / UC Course  I have reviewed the triage vital signs and the nursing notes.  Pertinent labs & imaging results that were available during my care of the patient were reviewed by me and considered in my medical decision making (see chart for details).    STD testing, allergy pending.  Patient declined HIV and RPR testing. Left foot callus, referral placed to [facility]. Patient advised that our office will contact only if results are abnormal.  Patient can view all results on MyChart.  Patient verbalized understanding and agreement with plan. Final Clinical Impressions(s) / UC Diagnoses   Final diagnoses:  Encounter for assessment of STD exposure  Foot callus, left      Discharge Instructions      I placed a referral for you to be evaluated at [facility].  They will call you to schedule an appointment or you can contact here office tomorrow.  Your lab results will be available in 24 hours.  Our clinical results nurse will reach out to you if any of your results are abnormal however if you have any questions related to your results feel free to contact our office directly.     ED Prescriptions   None    PDMP not reviewed this encounter.   Bing Neighbors, NP 02/06/24 1726

## 2024-02-06 NOTE — ED Triage Notes (Signed)
 Pt reports possible exposure to BV and ?chlamydia and would like testing.   Also reports pain on the bottom of his left foot. Callous noted below 3rd toe. Pain is worse in his foot when he wakes up. States he walks a lot for his job

## 2024-02-07 LAB — CYTOLOGY, (ORAL, ANAL, URETHRAL) ANCILLARY ONLY
Chlamydia: NEGATIVE
Comment: NEGATIVE
Comment: NEGATIVE
Comment: NORMAL
Neisseria Gonorrhea: NEGATIVE
Trichomonas: NEGATIVE

## 2024-02-17 ENCOUNTER — Ambulatory Visit: Admitting: Podiatry

## 2024-09-12 ENCOUNTER — Ambulatory Visit: Admitting: Family Medicine

## 2024-09-27 ENCOUNTER — Encounter: Payer: Self-pay | Admitting: Emergency Medicine

## 2024-09-27 ENCOUNTER — Ambulatory Visit
Admission: EM | Admit: 2024-09-27 | Discharge: 2024-09-27 | Disposition: A | Discharge status: Home / Self Care | Attending: Family Medicine | Admitting: Family Medicine

## 2024-09-27 DIAGNOSIS — Z202 Contact with and (suspected) exposure to infections with a predominantly sexual mode of transmission: Secondary | ICD-10-CM | POA: Insufficient documentation

## 2024-09-27 NOTE — ED Provider Notes (Signed)
 [name]    CSN: [id number] Arrival date & time: 09/27/24  0806      History   Chief Complaint Chief Complaint  Patient presents with   Exposure to STD    HPI Jeffrey Mack is a 33 y.o. male.    Exposure to STD  Here for possible exposure to STD. A partner has tested positive for chlamydia. This patient would like to be tested, including bloodwork for HIV and syphilis.  NKDA  History reviewed. No pertinent past medical history.  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 No pertinent surgical history.     Home Medications    Prior to Admission medications   Medication Sig Start Date End Date Taking? Authorizing Provider  ibuprofen  (ADVIL ) 800 MG tablet Take 1 tablet by mouth 3 (three) times daily as needed. 12/19/21   [provider]    Family History Family History  Problem Relation Age of Onset   Kidney disease Father    Lupus Father    Diabetes Maternal Grandfather    Heart  attack Paternal Grandfather     Social History Social History   Tobacco Use   Smoking status: Former    Types: Cigarettes  Vaping Use   Vaping status: Never Used  Substance Use Topics   Alcohol use: Not Currently   Drug use: Not Currently    Types: Marijuana     Allergies   Patient has no known allergies.   Review of Systems Review of Systems   Physical Exam Triage Vital Signs ED Triage Vitals  Encounter Vitals Group     BP 09/27/24 0847 137/83     Girls Systolic BP Percentile --      Girls Diastolic BP Percentile --      Boys Systolic BP Percentile --      Boys Diastolic BP Percentile --      Pulse Rate 09/27/24 0847 71     Resp 09/27/24 0847 14     Temp 09/27/24 0847 98.6 F (37 C)     Temp Source 09/27/24 0847 Oral     SpO2 09/27/24 0847 98 %     Weight --      Height --      Head Circumference --      Peak Flow --      Pain Score 09/27/24 0848 0     Pain Loc --      Pain Education --      Exclude from Growth Chart --  No data found.  Updated Vital Signs BP 137/83 (BP Location: Left Arm)   Pulse 71   Temp 98.6 F (37 C) (Oral)   Resp 14   SpO2 98%   Visual Acuity Right Eye Distance:   Left Eye Distance:   Bilateral Distance:    Right Eye Near:   Left Eye Near:    Bilateral Near:     Physical Exam Vitals reviewed.  Constitutional:      General: He is not in acute distress.    Appearance: He is not ill-appearing, toxic-appearing or diaphoretic.  Skin:    Coloration: Skin is not pale.  Neurological:     Mental Status: He is alert and oriented to person, place, and time.  Psychiatric:        Behavior: Behavior normal.      UC Treatments / Results  Labs (all labs ordered are listed, but only abnormal results are displayed) Labs Reviewed  HIV ANTIBODY (ROUTINE TESTING W REFLEX)  RPR  CYTOLOGY, (ORAL, ANAL, URETHRAL) ANCILLARY ONLY    EKG   Radiology No results found.  Procedures Procedures (including critical care  time)  Medications Ordered in UC Medications - No data to display  Initial Impression / Assessment and Plan / UC Course  I have reviewed the triage vital signs and the nursing notes.  Pertinent labs & imaging results that were available during my care of the patient were reviewed by me and considered in my medical decision making (see chart for details).     Blood is drawn for HIV and RPR, and staff will notify them if any of that is positive  Urethral self swab is done and staff will notify him of any positives and treat per protocol.  Final Clinical Impressions(s) / UC Diagnoses   Final diagnoses:  Potential exposure to STD     Discharge Instructions      Staff will notify you if there is anything positive on the swab or on the bloodwork. It can take 2-3 days for the tests to result, depending on the day of the week your test was taken. You will only be notified if there are any positives on the testing; test results will also go to your MyChart if you are signed up for MyChart.       ED Prescriptions   None    PDMP not reviewed this encounter.   Vonna Sharlet POUR, MD 09/27/24 [phone]

## 2024-09-27 NOTE — Discharge Instructions (Signed)
 Staff will notify you if there is anything positive on the swab or on the blood work. It can take 2-3 days for the tests to result, depending on the day of the week your test was taken. You will only be notified if there are any positives on the testing; test results will also go to your MyChart if you are signed up for MyChart.

## 2024-09-27 NOTE — ED Triage Notes (Signed)
 Pt reports known exposure to chlamydia x1 week ago. Partner tested positive. Pt is asymptomatic. Would like to complete cytology and blood work for STD screening.

## 2024-09-28 LAB — CYTOLOGY, (ORAL, ANAL, URETHRAL) ANCILLARY ONLY
Chlamydia: NEGATIVE
Comment: NEGATIVE
Comment: NEGATIVE
Comment: NORMAL
Neisseria Gonorrhea: NEGATIVE
Trichomonas: NEGATIVE

## 2024-09-28 LAB — HIV ANTIBODY (ROUTINE TESTING W REFLEX): HIV Screen 4th Generation wRfx: NONREACTIVE

## 2024-09-28 LAB — RPR: RPR Ser Ql: NONREACTIVE

## 2024-10-15 ENCOUNTER — Ambulatory Visit (INDEPENDENT_AMBULATORY_CARE_PROVIDER_SITE_OTHER): Admitting: Family

## 2024-10-15 ENCOUNTER — Encounter: Payer: Self-pay | Admitting: Family

## 2024-10-15 VITALS — BP 150/82 | HR 77 | Temp 98.0°F | Ht 74.0 in | Wt 269.4 lb

## 2024-10-15 DIAGNOSIS — E669 Obesity, unspecified: Secondary | ICD-10-CM | POA: Insufficient documentation

## 2024-10-15 DIAGNOSIS — M25551 Pain in right hip: Secondary | ICD-10-CM

## 2024-10-15 DIAGNOSIS — B372 Candidiasis of skin and nail: Secondary | ICD-10-CM | POA: Diagnosis not present

## 2024-10-15 DIAGNOSIS — R030 Elevated blood-pressure reading, without diagnosis of hypertension: Secondary | ICD-10-CM | POA: Diagnosis not present

## 2024-10-15 DIAGNOSIS — G8929 Other chronic pain: Secondary | ICD-10-CM

## 2024-10-15 MED ORDER — NAPROXEN 500 MG PO TABS: 500.0000 mg | ORAL_TABLET | Freq: Two times a day (BID) | ORAL | 1 refills | Status: AC | PRN
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
## 2024-10-15 NOTE — Patient Instructions (Signed)
 Welcome to [facility], It was a pleasure meeting you today!   I have sent in Naproxen  to take as needed for your hip/groin pain.  Do not take extra Ibuprofen , Motrin , Advil , etc with this. Ok to take Tylenol as needed. I will look out for your old medical records. Let me know when or if you need an orthopedic referral.  To maintain your blood pressure: Remember to drink at least 2-2.5 liters of non-caffeinated beverages daily, eat a low salt diet, shoot for 20-5minutes of cardio exercise daily, and keep working on weight loss.  Please schedule a physical with fasting labs when convenient.    PLEASE NOTE: If you had any LAB tests please let us  know if you have not heard back within a few days. You may see your results on MyChart before we have a chance to review them but we will give you a call once they are reviewed by us . If we ordered any REFERRALS today, please let us  know if you have not heard from their office within the next week.  Let us  know through MyChart if you are needing REFILLS, or have your pharmacy send us  the request. You can also use MyChart to communicate with me or any office staff.  Please try these tips to maintain a healthy lifestyle: It is important that you exercise regularly at least 30 minutes 5 times a week. Think about what you will eat, plan ahead. Choose whole foods, & think  clean, green, fresh or frozen over canned, processed or packaged foods which are more sugary, salty, and fatty. 70 to 75% of food eaten should be fresh vegetables and protein. 2-3  meals daily with healthy snacks between meals, but must be whole fruit, protein or vegetables. Aim to eat over a 10 hour period when you are active, for example, 7am to 5pm, and then STOP after your last meal of the day, drinking only water .  Shorter eating windows, 6-8 hours, are showing benefits in heart disease and blood sugar regulation. Drink water  every day! Shoot for  64 ounces daily = 8 cups, no other drink is as healthy! Fruit juice is best enjoyed in a healthy way, by EATING the fruit.

## 2024-10-19 ENCOUNTER — Ambulatory Visit: Admitting: Family

## 2024-10-19 ENCOUNTER — Encounter: Payer: Self-pay | Admitting: Family

## 2024-10-19 VITALS — BP 130/72 | HR 85 | Temp 98.2°F | Wt 270.6 lb

## 2024-10-19 DIAGNOSIS — M25551 Pain in right hip: Secondary | ICD-10-CM

## 2024-10-19 DIAGNOSIS — Z1159 Encounter for screening for other viral diseases: Secondary | ICD-10-CM | POA: Diagnosis not present

## 2024-10-19 DIAGNOSIS — M5431 Sciatica, right side: Secondary | ICD-10-CM

## 2024-10-19 DIAGNOSIS — G8929 Other chronic pain: Secondary | ICD-10-CM

## 2024-10-19 DIAGNOSIS — Z0000 Encounter for general adult medical examination without abnormal findings: Secondary | ICD-10-CM

## 2024-10-19 LAB — CBC WITH DIFFERENTIAL/PLATELET
Basophils Absolute: 0.1 K/uL (ref 0.0–0.1)
Basophils Relative: 1.1 % (ref 0.0–3.0)
Eosinophils Absolute: 0.1 K/uL (ref 0.0–0.7)
Eosinophils Relative: 1.7 % (ref 0.0–5.0)
HCT: 41 % (ref 39.0–52.0)
Hemoglobin: 13.5 g/dL (ref 13.0–17.0)
Lymphocytes Relative: 36.2 % (ref 12.0–46.0)
Lymphs Abs: 1.7 K/uL (ref 0.7–4.0)
MCHC: 32.9 g/dL (ref 30.0–36.0)
MCV: 91 fl (ref 78.0–100.0)
Monocytes Absolute: 0.5 K/uL (ref 0.1–1.0)
Monocytes Relative: 9.8 % (ref 3.0–12.0)
Neutro Abs: 2.4 K/uL (ref 1.4–7.7)
Neutrophils Relative %: 51.2 % (ref 43.0–77.0)
Platelets: 237 K/uL (ref 150.0–400.0)
RBC: 4.51 Mil/uL (ref 4.22–5.81)
RDW: 13.9 % (ref 11.5–15.5)
WBC: 4.7 K/uL (ref 4.0–10.5)

## 2024-10-19 LAB — LIPID PANEL
Cholesterol: 166 mg/dL (ref 0–200)
HDL: 61.9 mg/dL (ref >39.00)
LDL Cholesterol: 99 mg/dL (ref 0–99)
NonHDL: 104.46
Total CHOL/HDL Ratio: 3
Triglycerides: 26 mg/dL (ref 0.0–149.0)
VLDL: 5.2 mg/dL (ref 0.0–40.0)

## 2024-10-19 LAB — COMPREHENSIVE METABOLIC PANEL WITH GFR
ALT: 20 U/L (ref 0–53)
AST: 22 U/L (ref 0–37)
Albumin: 4.6 g/dL (ref 3.5–5.2)
Alkaline Phosphatase: 121 U/L — ABNORMAL HIGH (ref 39–117)
BUN: 18 mg/dL (ref 6–23)
CO2: 27 meq/L (ref 19–32)
Calcium: 9.2 mg/dL (ref 8.4–10.5)
Chloride: 101 meq/L (ref 96–112)
Creatinine, Ser: 0.98 mg/dL (ref 0.40–1.50)
GFR: 101.63 mL/min (ref >60.00)
Glucose, Bld: 82 mg/dL (ref 70–99)
Potassium: 4.2 meq/L (ref 3.5–5.1)
Sodium: 135 meq/L (ref 135–145)
Total Bilirubin: 0.4 mg/dL (ref 0.2–1.2)
Total Protein: 7.6 g/dL (ref 6.0–8.3)

## 2024-10-19 LAB — HEMOGLOBIN A1C: Hgb A1c MFr Bld: 5.8 % (ref 4.6–6.5)

## 2024-10-19 LAB — TSH: TSH: 2.62 u[IU]/mL (ref 0.35–5.50)

## 2024-10-19 NOTE — Patient Instructions (Signed)
 It was very nice to see you today!   I will review your lab results via MyChart in a few days.  You look great! Stay well! Keep exercising!  Have a wonderful Christmas :-)    PLEASE NOTE:  If you had any lab tests please let us  know if you have not heard back within a few days. You may see your results on MyChart before we have a chance to review them but we will give you a call once they are reviewed by us . If we ordered any referrals today, please let us  know if you have not heard from their office within the next week.

## 2024-10-19 NOTE — Progress Notes (Signed)
 Phone: [phone]  Subjective:  Jeffrey Mack 33 y.o. male presenting for annual physical.  Chief Complaint  Jeffrey Mack presents with   Annual Exam    Fasting w/ labs  Discussed the use of AI scribe software for clinical note transcription with the Jeffrey Mack, who gave verbal consent to proceed.  History of Present Illness Jeffrey Mack is a 34 year old male who presents for an annual physical exam and evaluation of right hip pain.  He has ongoing right hip pain that sometimes starts in his back and radiates down his side. The pain varies in intensity and location and is reproducible with palpation in certain areas. He has not used specific treatments for this pain.  He is an ex-smoker, with a 10-year history starting around age 24. He denies current THC use. He exercises regularly and follows a whole-food diet while limiting white carbohydrates. He drinks alcohol, amount not specified.  He recently completed STD testing including HIV and hepatitis C and does not need repeat testing today. He reports regular bowel movements without current issues. He has not yet established care with a dentist.  See problem oriented charting- ROS- full  review of systems was completed and negative except for what is noted in HPI above.  The following were reviewed and entered/updated in epic: Past Medical History:  Diagnosis Date   Atypical chest pain 04/22/2015   Body mass index (BMI) 40.0-44.9, adult (HCC) 04/16/2022   Cellulitis of skin 05/27/2022   Contact with and (suspected) exposure to covid-19 11/17/2020   COVID-19 11/17/2020   Cramp and spasm 05/19/2022   Low back pain 09/21/2021   Morbid obesity (HCC) 11/19/2021   Muscle weakness (generalized) 04/16/2022   Nocturia 03/04/2022   Other injury of unspecified body region, initial encounter 03/04/2022   Recent weight loss 03/04/2022   Spider bite wound 05/27/2022   Strain of muscle of right groin region 09/21/2021   Jeffrey Mack Active Problem  List   Diagnosis Date Noted   Obesity (BMI 30-39.9) 10/15/2024   Elevated blood pressure reading in office without diagnosis of hypertension 10/15/2024   Pain in right knee 05/19/2022   Panic disorder (episodic paroxysmal anxiety) 05/19/2022   Varicose veins of lower extremity 04/16/2022   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   Lumbar spondylosis 03/04/2022   Sciatica, right side 03/04/2022   Gastroesophageal reflux disease 09/21/2021   Chronic right hip pain 09/21/2021   Cardiomegaly 04/22/2015   Past Surgical History:  Procedure Laterality Date   WISDOM TOOTH EXTRACTION      Family History  Problem Relation Age of Onset   Healthy Mother    Kidney disease Father    Lupus Father    Diabetes Maternal Grandfather    High blood pressure Maternal Grandfather    Heart attack Paternal Grandfather    Medications- reviewed and updated Current Outpatient Medications  Medication Sig Dispense Refill   naproxen  (NAPROSYN ) 500 MG tablet Take 1 tablet (500 mg total) by mouth 2 (two) times daily as needed for moderate pain (pain score 4-6) (For hip/groin pain). 30 tablet 1   traMADol (ULTRAM-ER) 100 MG 24 hr tablet Take 200 mg by mouth daily as needed for pain.     No current facility-administered medications for this visit.    Allergies-reviewed and updated No Known Allergies  Social History   Social History Narrative   Not on file    Objective:  BP 130/72 (BP Location: Left Arm, Jeffrey Mack Position: Sitting, Cuff Size: Large)   Pulse 85   Temp 98.2  F (36.8 C) (Temporal)   Wt 270 lb 9.6 oz (122.7 kg)   SpO2 97%   BMI 34.74 kg/m  Physical Exam Vitals and nursing note reviewed.  Constitutional:      General: He is not in acute distress.    Appearance: Normal appearance. He is obese.  HENT:     Head: Normocephalic.     Right Ear: Tympanic membrane and external ear normal.     Left Ear: Tympanic membrane and external ear normal.     Nose: Nose normal.     Mouth/Throat:     Mouth:  Mucous membranes are moist.  Eyes:     Extraocular Movements: Extraocular movements intact.  Cardiovascular:     Rate and Rhythm: Normal rate and regular rhythm.  Pulmonary:     Effort: Pulmonary effort is normal.     Breath sounds: Normal breath sounds.  Abdominal:     General: Abdomen is flat. There is no distension.     Palpations: Abdomen is soft.     Tenderness: There is no abdominal tenderness.  Musculoskeletal:        General: Normal range of motion.     Cervical back: Normal range of motion.  Skin:    General: Skin is warm and dry.  Neurological:     Mental Status: He is alert and oriented to person, place, and time.  Psychiatric:        Mood and Affect: Mood normal.        Behavior: Behavior normal.        Judgment: Judgment normal.     Assessment and Plan   Health Maintenance counseling: 1. Anticipatory guidance: Jeffrey Mack counseled regarding regular dental exams q6 months, eye exams yearly, avoiding smoking and second hand smoke, limiting alcohol to 2 beverages per day.   2. Risk factor reduction:  Advised Jeffrey Mack of need for regular exercise and diet rich in fruits and vegetables to reduce risk of heart attack and stroke.    Wt Readings from Last 3 Encounters:  10/19/24 270 lb 9.6 oz (122.7 kg)  10/15/24 269 lb 6.4 oz (122.2 kg)  04/22/15 (!) 343 lb 14.4 oz (156 kg)   3. Immunizations/screenings/ancillary studies Immunization History  Administered Date(s) Administered   Influenza-Unspecified 09/13/2024   Tdap 03/26/2016, 04/28/2021   There are no preventive care reminders to display for this Jeffrey Mack.  4. Skin cancer screening-  advised regular sunscreen use. Denies worrisome, changing, or new skin lesions.  5. Smoking associated screening:   ex- smoker - for about 26yrs 6. STD screening - declines, recently done 7. Alcohol screening: rare 8. Exercise: daily   Assessment & Plan Chronic right hip pain and right-sided sciatica Chronic right hip pain with leg  radiation, consistent with sciatica. Old records and imaging results under Media in EMR. Previous sciatica diagnosis noted. - Referred to orthopedic specialist for further evaluation and management.  Annual physical Discussed lifestyle modifications including exercise, diet, and dental care. Emphasized exercise for cancer and dementia prevention, and benefits of a healthy diet. - Encouraged at least 20-30 minutes of cardio exercise daily. - Advised on a healthy diet, avoiding white carbs and opting for fiber alternatives, avoid processed foods. - Recommended establishing care with a dentist. - Provided preventative health information.  Recommended follow up: Return for any future concerns, Complete physical w/fasting labs. No future appointments.  Lab/Order associations:  fasting   Lucius Krabbe, NP

## 2024-10-20 LAB — HEPATITIS C ANTIBODY: Hepatitis C Ab: NONREACTIVE

## 2024-10-23 ENCOUNTER — Ambulatory Visit: Payer: Self-pay | Admitting: Family

## 2024-11-02 ENCOUNTER — Other Ambulatory Visit (INDEPENDENT_AMBULATORY_CARE_PROVIDER_SITE_OTHER)

## 2024-11-02 ENCOUNTER — Other Ambulatory Visit: Payer: Self-pay

## 2024-11-02 ENCOUNTER — Ambulatory Visit: Admitting: Sports Medicine

## 2024-11-02 ENCOUNTER — Encounter: Payer: Self-pay | Admitting: Sports Medicine

## 2024-11-02 DIAGNOSIS — M545 Low back pain, unspecified: Secondary | ICD-10-CM | POA: Diagnosis not present

## 2024-11-02 DIAGNOSIS — M25551 Pain in right hip: Secondary | ICD-10-CM

## 2024-11-02 DIAGNOSIS — M1611 Unilateral primary osteoarthritis, right hip: Secondary | ICD-10-CM

## 2024-11-02 DIAGNOSIS — M5136 Other intervertebral disc degeneration, lumbar region with discogenic back pain only: Secondary | ICD-10-CM | POA: Diagnosis not present

## 2024-11-02 DIAGNOSIS — G8929 Other chronic pain: Secondary | ICD-10-CM | POA: Diagnosis not present

## 2024-11-02 NOTE — Progress Notes (Signed)
 Patient says that he has had pain in the low back and right leg for years. He points over the entire low back, and the posterior and anterior hip/groin when describing his pain. He says that his pain is worse and he will have difficulty walking after sitting for extended periods of time. He sometimes has numbness and tingling in the anterior right thigh, but does not notice that these symptoms go down the leg or into the foot. He was prescribed Naproxen , although has not taken any yet.

## 2024-11-02 NOTE — Progress Notes (Signed)
 "  Jeffrey Mack - 33 y.o. male MRN [id number]  Date of birth: 07-20-1991  Office Visit Note: Visit Date: 11/02/2024 PCP: Lucius Krabbe, NP Referred by: Lucius Krabbe, NP  Subjective: Chief Complaint  Patient presents with   Lower Back - Pain   Right Hip - Pain   HPI: Jeffrey Mack is a pleasant 33 y.o. male who presents today for chronic right hip and low back pain. He works office manager at [employer].  Kaire states that he has had pain in the low back and right leg for years. He points over the entire low back, and the posterior and anterior hip/groin when describing his pain. He says that his pain is worse and he will have difficulty walking after sitting for extended periods of time. He sometimes has numbness and tingling in the anterior right thigh, but does not notice that these symptoms go down the leg or into the foot. He was prescribed Naproxen , although has not taken any yet.  *Independent note review from [facility] (2022) in care everywhere today. Previously had care at [facility] at [place].  This was back in 2022.  He did have some degenerative change and mild dextroscoliosis of the low back.  They did think he had sciatica at that point.  *Did see his primary NP on 12/1 and 10/19/2024.  She did prescribe him naproxen  500 mg twice daily, but has not picked this up yet.  Pertinent ROS were reviewed with the patient and found to be negative unless otherwise specified above in HPI.   Assessment & Plan: Visit Diagnoses:  1. Chronic right hip pain   2. Unilateral primary osteoarthritis, right hip   3. Degeneration of intervertebral disc of lumbar region with discogenic back pain   4. Chronic bilateral low back pain, unspecified whether sciatica present    Plan: Impression is acute on chronic right hip pain which actually has mild to moderate arthritic change which is more advanced than normal given the patient's age.  Does likely have some calcification within  the labrum as well, and on physical exam his hip is certainly irritable.  He does have a mild dextroscoliosis of the lumbar spine with early DDD changes, although I do not believe there is any true radiculopathy involved.  He is quite tight with his hip flexors and has a degree of lower cross syndrome which is contributing to both of the above.  I do think he will benefit from naproxen  500 mg twice daily for 10 days only and then discontinue.  I would like to get him started in formalized physical therapy to strengthen and stabilize the low back as well as work on hip and pelvic mobility.   His mother was present during phone call today, she talked about hip injection.  I did discuss the nature of this for pain relief although this would not cure his arthritic and bony changes.  He would like to start with PT but could consider this going forward.  We will follow-up in about 6 weeks as he progresses through PT to reevaluate.  Follow-up: Return in about 6 weeks (around 12/14/2024) for R-hip/LBP.   Meds & Orders: No orders of the defined types were placed in this encounter.   Orders Placed This Encounter  Procedures   XR HIP UNILAT W OR W/O PELVIS 2-3 VIEWS RIGHT   XR Lumbar Spine 2-3 Views     Procedures: No procedures performed      Clinical History: No specialty  comments available.  He reports that he quit smoking about 4 years ago. His smoking use included cigarettes. He does not have any smokeless tobacco history on file.  Recent Labs    10/19/24 0903  HGBA1C 5.8    Objective:    Physical Exam  Gen: Well-appearing, in no acute distress; non-toxic CV: Well-perfused. Warm.  Resp: Breathing unlabored on room air; no wheezing. Psych: Fluid speech in conversation; appropriate affect; normal thought process  Ortho Exam - Right hip: No pain with internal or external logroll.  Positive FADIR test, equivocal FABER test, positive Stinchfield test.  No significant tenderness over the  greater trochanteric region.  - Lumbar: There is paraspinal hypertonicity more so at the upper lumbar region near the L1 and L2 region.  Mild degree of pain in the low back with endrange extension.  Full range flexion.  Negative straight leg raise bilaterally.  Imaging: XR HIP UNILAT W OR W/O PELVIS 2-3 VIEWS RIGHT Result Date: 11/02/2024 There is at least mild arthritic change of the right hip joint with mild to moderate narrowing of the inferior medial aspect of the hip.  There is osteophytosis noted at the femoral head and neck junction with a soft tissue calcification noted over the superior lateral region, likely reflect labral calcification.  The contralateral hip does have minimal arthritic change as well.  No acute fracture noted.  XR Lumbar Spine 2-3 Views Result Date: 11/02/2024 2 view of the lumbar spine including AP and lateral film were ordered and reviewed by myself today.  There is mild degenerative dextroscoliosis noted.  There is at least mild degenerative disc disease at the L1-L2 level, as well as mild degenerative facet hypertrophy at the L5 and S1 level.  No significant disc height loss.  There is a grade 1 retrolisthesis of L5 on S1.  *Review of previous x-rays and report from [facility], [facility], from 09/21/2021 of the lumbar spine and right hip x-rays were reviewed by myself today:  4 VIEW LUMBAR SPINE, 2 VIEWS RIGHT HIP   Clinical Indication:  Chronic back pain, Right hip pain.   Technique: AP, lateral, and bilateral oblique lumbar spine views were obtained. Frontal pelvis and dedicated frog leg right hip radiographs were obtained.   Comparison: None.   Findings:  There is mild gentle dextrocurvature of the lumbar spine. 5 nonrib-bearing lumbar-type vertebral bodies are identified. There is straightening of the normal lumbar lordosis. Stepwise trace retrolisthesis from L1-2 through L4-5 with moderate retrolisthesis L5 on S1. There is mild intervertebral disc space  narrowing at L1-2. Disc spaces at L2-3 and L3-4 grossly maintained. Mild to moderate narrowing at L4-5 and L5-S1. There are minimal reactive endplate changes at these 2 levels. There is mild ventral predominant osteophytosis in the lower thoracic spine, where there is also mild-to-moderate disc space narrowing. Mild degenerative facet hypertrophy at L4-5 and L5-S1. No acute fracture or destructive osseous lesion.   Sacroiliac joints are symmetric. Osseous pelvis is intact. Pubic symphysis is maintained. Femoroacetabular joints are located with maintained joint spaces. No destructive osseous lesion of the pelvis.   No focal soft tissue swelling or radiopaque foreign body.   Past Medical/Family/Surgical/Social History: Medications & Allergies reviewed per EMR, new medications updated. 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   Lumbar spondylosis 03/04/2022   Sciatica, right side 03/04/2022   Gastroesophageal reflux disease 09/21/2021   Chronic right hip pain 09/21/2021   Cardiomegaly 04/22/2015   Past Medical History:  Diagnosis Date   Atypical chest pain 04/22/2015   Body mass index (BMI) 40.0-44.9, adult (HCC) 04/16/2022   Cellulitis of skin 05/27/2022   Contact with and (suspected) exposure to covid-19 11/17/2020   COVID-19 11/17/2020   Cramp and spasm 05/19/2022   Low back pain 09/21/2021   Morbid obesity (HCC) 11/19/2021   Muscle weakness (generalized) 04/16/2022   Nocturia 03/04/2022   Other injury of unspecified body region, initial encounter 03/04/2022   Recent weight loss 03/04/2022   Spider bite wound 05/27/2022   Strain of muscle of right groin region 09/21/2021   Family  History  Problem Relation Age of Onset   Healthy Mother    Kidney disease Father    Lupus Father    Diabetes Maternal Grandfather    High blood pressure Maternal Grandfather    Heart attack Paternal Grandfather    Past Surgical History:  Procedure Laterality Date   WISDOM TOOTH EXTRACTION     Social History   Occupational History   Not on file  Tobacco Use   Smoking status: Former    Current packs/day: 0.00    Types: Cigarettes    Quit date: 2021    Years since quitting: 4.9   Smokeless tobacco: Not on file  Vaping Use   Vaping status: Never Used  Substance and Sexual Activity   Alcohol use: Not Currently   Drug use: Not Currently    Types: Marijuana   Sexual activity: Yes    Birth control/protection: None   "

## 2024-12-14 ENCOUNTER — Ambulatory Visit: Admitting: Sports Medicine

## 2025-10-22 ENCOUNTER — Encounter: Admitting: Family
